# Patient Record
Sex: Male | Born: 1960 | Race: White | Hispanic: No | Marital: Married | Smoking: Former smoker
Health system: Southern US, Community
[De-identification: ages and names within clinical notes are randomized; demographics above are authoritative.]

## PROBLEM LIST (undated history)

## (undated) DIAGNOSIS — M109 Gout, unspecified: Secondary | ICD-10-CM

## (undated) DIAGNOSIS — K219 Gastro-esophageal reflux disease without esophagitis: Secondary | ICD-10-CM

## (undated) DIAGNOSIS — E7800 Pure hypercholesterolemia, unspecified: Secondary | ICD-10-CM

## (undated) DIAGNOSIS — I2510 Atherosclerotic heart disease of native coronary artery without angina pectoris: Secondary | ICD-10-CM

## (undated) DIAGNOSIS — I10 Essential (primary) hypertension: Secondary | ICD-10-CM

## (undated) DIAGNOSIS — C61 Malignant neoplasm of prostate: Secondary | ICD-10-CM

## (undated) HISTORY — PX: PROSTATE BIOPSY: SHX241

## (undated) HISTORY — DX: Atherosclerotic heart disease of native coronary artery without angina pectoris: I25.10

## (undated) HISTORY — PX: ORIF ANKLE FRACTURE: SHX5408

## (undated) HISTORY — PX: APPENDECTOMY: SHX54

---

## 1986-09-27 HISTORY — PX: SKIN GRAFT: SHX250

## 2010-01-04 ENCOUNTER — Ambulatory Visit: Payer: Self-pay | Admitting: Interventional Radiology

## 2010-01-04 ENCOUNTER — Emergency Department (HOSPITAL_BASED_OUTPATIENT_CLINIC_OR_DEPARTMENT_OTHER): Admission: EM | Admit: 2010-01-04 | Discharge: 2010-01-04 | Payer: Self-pay | Admitting: Emergency Medicine

## 2013-03-20 ENCOUNTER — Other Ambulatory Visit (HOSPITAL_BASED_OUTPATIENT_CLINIC_OR_DEPARTMENT_OTHER): Payer: Self-pay | Admitting: Family Medicine

## 2013-03-20 DIAGNOSIS — R1012 Left upper quadrant pain: Secondary | ICD-10-CM

## 2013-03-20 DIAGNOSIS — S298XXA Other specified injuries of thorax, initial encounter: Secondary | ICD-10-CM

## 2013-03-21 ENCOUNTER — Ambulatory Visit (HOSPITAL_BASED_OUTPATIENT_CLINIC_OR_DEPARTMENT_OTHER)
Admission: RE | Admit: 2013-03-21 | Discharge: 2013-03-21 | Disposition: A | Discharge status: Home / Self Care | Payer: BC Managed Care – PPO | Source: Ambulatory Visit | Attending: Family Medicine | Admitting: Family Medicine

## 2013-03-21 ENCOUNTER — Encounter (HOSPITAL_BASED_OUTPATIENT_CLINIC_OR_DEPARTMENT_OTHER): Payer: Self-pay

## 2013-03-21 DIAGNOSIS — S2249XA Multiple fractures of ribs, unspecified side, initial encounter for closed fracture: Secondary | ICD-10-CM | POA: Insufficient documentation

## 2013-03-21 DIAGNOSIS — I700 Atherosclerosis of aorta: Secondary | ICD-10-CM | POA: Insufficient documentation

## 2013-03-21 DIAGNOSIS — R1012 Left upper quadrant pain: Secondary | ICD-10-CM

## 2013-03-21 DIAGNOSIS — S32009A Unspecified fracture of unspecified lumbar vertebra, initial encounter for closed fracture: Secondary | ICD-10-CM | POA: Insufficient documentation

## 2013-03-21 DIAGNOSIS — R109 Unspecified abdominal pain: Secondary | ICD-10-CM | POA: Insufficient documentation

## 2013-03-21 DIAGNOSIS — S298XXA Other specified injuries of thorax, initial encounter: Secondary | ICD-10-CM

## 2013-03-21 DIAGNOSIS — W19XXXA Unspecified fall, initial encounter: Secondary | ICD-10-CM | POA: Insufficient documentation

## 2013-03-21 HISTORY — DX: Essential (primary) hypertension: I10

## 2013-03-21 MED ORDER — IOHEXOL 300 MG/ML  SOLN
100.0000 mL | Freq: Once | INTRAMUSCULAR | Status: AC | PRN
  Administered 2013-03-21: 100 mL via INTRAVENOUS

## 2013-06-23 ENCOUNTER — Emergency Department (HOSPITAL_BASED_OUTPATIENT_CLINIC_OR_DEPARTMENT_OTHER)
Admission: EM | Admit: 2013-06-23 | Discharge: 2013-06-23 | Disposition: A | Discharge status: Home / Self Care | Payer: BC Managed Care – PPO | Attending: Emergency Medicine | Admitting: Emergency Medicine

## 2013-06-23 ENCOUNTER — Encounter (HOSPITAL_BASED_OUTPATIENT_CLINIC_OR_DEPARTMENT_OTHER): Payer: Self-pay

## 2013-06-23 DIAGNOSIS — R233 Spontaneous ecchymoses: Secondary | ICD-10-CM

## 2013-06-23 DIAGNOSIS — K219 Gastro-esophageal reflux disease without esophagitis: Secondary | ICD-10-CM | POA: Insufficient documentation

## 2013-06-23 DIAGNOSIS — I10 Essential (primary) hypertension: Secondary | ICD-10-CM | POA: Insufficient documentation

## 2013-06-23 DIAGNOSIS — E7800 Pure hypercholesterolemia, unspecified: Secondary | ICD-10-CM | POA: Insufficient documentation

## 2013-06-23 DIAGNOSIS — R21 Rash and other nonspecific skin eruption: Secondary | ICD-10-CM | POA: Insufficient documentation

## 2013-06-23 DIAGNOSIS — Z79899 Other long term (current) drug therapy: Secondary | ICD-10-CM | POA: Insufficient documentation

## 2013-06-23 DIAGNOSIS — D692 Other nonthrombocytopenic purpura: Secondary | ICD-10-CM | POA: Insufficient documentation

## 2013-06-23 HISTORY — DX: Gastro-esophageal reflux disease without esophagitis: K21.9

## 2013-06-23 HISTORY — DX: Pure hypercholesterolemia, unspecified: E78.00

## 2013-06-23 LAB — URINALYSIS, ROUTINE W REFLEX MICROSCOPIC
Glucose, UA: NEGATIVE mg/dL
Hgb urine dipstick: NEGATIVE
Nitrite: NEGATIVE
Protein, ur: NEGATIVE mg/dL
Specific Gravity, Urine: 1.012 (ref 1.005–1.030)
Urobilinogen, UA: 1 mg/dL (ref 0.0–1.0)
pH: 5.5 (ref 5.0–8.0)

## 2013-06-23 LAB — COMPREHENSIVE METABOLIC PANEL
AST: 49 U/L — ABNORMAL HIGH (ref 0–37)
Alkaline Phosphatase: 112 U/L (ref 39–117)
BUN: 16 mg/dL (ref 6–23)
CO2: 28 mEq/L (ref 19–32)
Calcium: 9.5 mg/dL (ref 8.4–10.5)
GFR calc Af Amer: 78 mL/min — ABNORMAL LOW (ref >90)
Potassium: 3.1 mEq/L — ABNORMAL LOW (ref 3.5–5.1)
Sodium: 135 mEq/L (ref 135–145)

## 2013-06-23 LAB — APTT: aPTT: 34 seconds (ref 24–37)

## 2013-06-23 LAB — CBC WITH DIFFERENTIAL/PLATELET
Basophils Absolute: 0.1 10*3/uL (ref 0.0–0.1)
Basophils Relative: 0 % (ref 0–1)
Eosinophils Absolute: 0 10*3/uL (ref 0.0–0.7)
Eosinophils Relative: 0 % (ref 0–5)
Lymphocytes Relative: 11 % — ABNORMAL LOW (ref 12–46)
Lymphs Abs: 1.4 10*3/uL (ref 0.7–4.0)
MCV: 97.8 fL (ref 78.0–100.0)
Monocytes Relative: 7 % (ref 3–12)
Neutro Abs: 10.6 10*3/uL — ABNORMAL HIGH (ref 1.7–7.7)
RBC: 4.01 MIL/uL — ABNORMAL LOW (ref 4.22–5.81)
RDW: 14.8 % (ref 11.5–15.5)

## 2013-06-23 NOTE — ED Provider Notes (Signed)
CSN: [id number]     Arrival date & time 06/23/13  1811 History   First MD Initiated Contact with Patient 06/23/13 1828     Chief Complaint  Patient presents with  . Bleeding/Bruising   (Consider location/radiation/quality/duration/timing/severity/associated sxs/prior Treatment) HPI Comments: Patient presents with complaint of rash that began 3 days ago on his feet and gradually moved to his legs and abdomen below waist. Patient has noted rash on arms today. He has approximately 15 pound weight loss over the past several months. The tick bites, no medications or exposures. Patient endorses fatigue but states this is not new recently. He denies fever but has night sweats, which also are not new. He denies recent upper respiratory tract infection or illness. He denies chest pain, cough, shortness of breath. He denies nausea, vomiting, diarrhea, urinary symptoms. Patient denies easy bruising, blood in his stool or urine. No history of similar rash. Onset of symptoms gradual. Course is gradually worsening. Nothing makes symptoms better or worse.  The history is provided by the patient.    Past Medical History  Diagnosis Date  . Hypertension   . High cholesterol   . Acid reflux    History reviewed. No pertinent past surgical history. No family history on file. History  Substance Use Topics  . Smoking status: Never Smoker   . Smokeless tobacco: Not on file  . Alcohol Use: Not on file    Review of Systems  Constitutional: Negative for fever.  HENT: Negative for sore throat and rhinorrhea.   Eyes: Negative for redness.  Respiratory: Negative for cough.   Cardiovascular: Negative for chest pain.  Gastrointestinal: Negative for nausea, vomiting, abdominal pain and diarrhea.  Genitourinary: Negative for dysuria.  Musculoskeletal: Negative for myalgias.  Skin: Positive for rash.  Neurological: Negative for headaches.  Hematological: Negative for adenopathy. Does not bruise/bleed easily.     Allergies  Review of patient's allergies indicates no known allergies.  Home Medications   Current Outpatient Rx  Name  Route  Sig  Dispense  Refill  . diltiazem (TIAZAC) 240 MG 24 hr capsule   Oral   Take 240 mg by mouth daily.         . fish oil-omega-3 fatty acids 1000 MG capsule   Oral   Take 2 g by mouth daily.         . indomethacin (INDOCIN SR) 75 MG CR capsule   Oral   Take 75 mg by mouth 2 (two) times daily with a meal.         . lovastatin (MEVACOR) 20 MG tablet   Oral   Take 20 mg by mouth at bedtime.         . RABEprazole (ACIPHEX) 20 MG tablet   Oral   Take 20 mg by mouth daily.         . valsartan-hydrochlorothiazide (DIOVAN-HCT) 160-25 MG per tablet   Oral   Take 1 tablet by mouth daily.         . vitamin E (VITAMIN E) 400 UNIT capsule   Oral   Take 400 Units by mouth daily.          BP 122/77  Pulse 102  Temp(Src) 98.2 F (36.8 C) (Oral)  Resp 18  Ht 6\' 4"  (1.93 m)  Wt 260 lb (117.935 kg)  BMI 31.66 kg/m2  SpO2 98% Physical Exam  Nursing note and vitals reviewed. Constitutional: He appears well-developed and well-nourished.  HENT:  Head: Normocephalic and atraumatic.  Normal intraoral  mucosa.  Eyes: Conjunctivae are normal. Right eye exhibits no discharge. Left eye exhibits no discharge.  Conjunctivae normal, no pallor  Neck: Normal range of motion. Neck supple.  Cardiovascular: Normal rate, regular rhythm and normal heart sounds.   Pulmonary/Chest: Effort normal and breath sounds normal.  Abdominal: Soft. There is no tenderness.  Neurological: He is alert.  Skin: Skin is warm and dry. Petechiae, purpura and rash noted. No abrasion, no bruising and no ecchymosis noted.     Psychiatric: He has a normal mood and affect.    ED Course  Procedures (including critical care time) Labs Review Labs Reviewed  CBC WITH DIFFERENTIAL - Abnormal; Notable for the following:    WBC 12.9 (*)    RBC 4.01 (*)    MCH 34.4 (*)     Neutrophils Relative % 82 (*)    Neutro Abs 10.6 (*)    Lymphocytes Relative 11 (*)    All other components within normal limits  COMPREHENSIVE METABOLIC PANEL - Abnormal; Notable for the following:    Potassium 3.1 (*)    Chloride 95 (*)    Glucose, Bld 112 (*)    Albumin 3.4 (*)    AST 49 (*)    Total Bilirubin 1.3 (*)    GFR calc non Af Amer 67 (*)    GFR calc Af Amer 78 (*)    All other components within normal limits  PROTIME-INR  APTT  URINALYSIS, ROUTINE W REFLEX MICROSCOPIC  ROCKY MTN SPOTTED FVR AB, IGG-BLOOD  ROCKY MTN SPOTTED FVR AB, IGM-BLOOD   Imaging Review No results found.  Patient seen and examined. D/w Dr. Fredderick Phenix who has seen. Work-up initiated. Medications ordered. Reviewed CT chest from 07/14.  Vital signs reviewed and are as follows: Filed Vitals:   06/23/13 1818  BP: 122/77  Pulse: 102  Temp: 98.2 F (36.8 C)  Resp: 18   Labs reviewed. Patient informed.  I have spoken with Dr. Wynelle Link, who is on call for Dr. Foy Guadalajara. No further testing requested. She will have office call patient for f/u on Monday morning (2 days).   Patient informed of plan and him and wife are in agreement. Patient told to return with changing symptoms, fever, significantly worsening rash. [place] Spotted Fever titers are pending.  Patient told to return with worsening or changing symptoms, including fever.    MDM   1. Petechial rash    Patient with impressive petechial/purpuric rash. Platelets normal. No concern for meningococcemia given rash without other symptoms. Patient appears well, non-toxic. RMSF felt unlikely but titers sent. ?viral etiology although no recent or other symptoms. Indomethacin, which patient used once recently, could be inciting factor and patient urged to discontinue for time being. At this point, no lab or PE findings that would warrant admission. Close PCP f/u obtained.    Renne Crigler, PA-C 06/24/13 0009

## 2013-06-23 NOTE — ED Notes (Signed)
Patient here with fine purpura rash to lower extremities and now on right arm, reports started on Wednesday. Denies any illness prior to the rash

## 2013-06-24 NOTE — ED Provider Notes (Signed)
Medical screening examination/treatment/procedure(s) were performed by non-physician practitioner and as supervising physician I was immediately available for consultation/collaboration.   Rolan Bucco, MD 06/24/13 [phone]

## 2013-06-26 LAB — ROCKY MTN SPOTTED FVR AB, IGG-BLOOD: RMSF IgG: 0.39 IV

## 2014-05-09 ENCOUNTER — Encounter (HOSPITAL_COMMUNITY)
Admission: AD | Disposition: A | Discharge status: Home / Self Care | Payer: Self-pay | Source: Ambulatory Visit | Attending: Orthopedic Surgery

## 2014-05-09 ENCOUNTER — Inpatient Hospital Stay (HOSPITAL_COMMUNITY): Payer: BC Managed Care – PPO | Admitting: Anesthesiology

## 2014-05-09 ENCOUNTER — Encounter (HOSPITAL_COMMUNITY): Payer: BC Managed Care – PPO | Admitting: Anesthesiology

## 2014-05-09 ENCOUNTER — Ambulatory Visit (HOSPITAL_COMMUNITY)
Admission: AD | Admit: 2014-05-09 | Discharge: 2014-05-09 | Disposition: A | Discharge status: Home / Self Care | Payer: BC Managed Care – PPO | Source: Ambulatory Visit | Attending: Orthopedic Surgery | Admitting: Orthopedic Surgery

## 2014-05-09 ENCOUNTER — Inpatient Hospital Stay (HOSPITAL_COMMUNITY): Payer: BC Managed Care – PPO

## 2014-05-09 ENCOUNTER — Encounter (HOSPITAL_COMMUNITY): Payer: Self-pay | Admitting: Orthopedic Surgery

## 2014-05-09 DIAGNOSIS — IMO0002 Reserved for concepts with insufficient information to code with codable children: Secondary | ICD-10-CM | POA: Diagnosis present

## 2014-05-09 DIAGNOSIS — Z79899 Other long term (current) drug therapy: Secondary | ICD-10-CM | POA: Insufficient documentation

## 2014-05-09 DIAGNOSIS — I10 Essential (primary) hypertension: Secondary | ICD-10-CM | POA: Insufficient documentation

## 2014-05-09 DIAGNOSIS — W2203XA Walked into furniture, initial encounter: Secondary | ICD-10-CM | POA: Insufficient documentation

## 2014-05-09 DIAGNOSIS — K219 Gastro-esophageal reflux disease without esophagitis: Secondary | ICD-10-CM | POA: Diagnosis not present

## 2014-05-09 DIAGNOSIS — E7800 Pure hypercholesterolemia, unspecified: Secondary | ICD-10-CM | POA: Diagnosis not present

## 2014-05-09 HISTORY — DX: Gout, unspecified: M10.9

## 2014-05-09 HISTORY — PX: PERCUTANEOUS PINNING: SHX2209

## 2014-05-09 LAB — BASIC METABOLIC PANEL
ANION GAP: 21 — AB (ref 5–15)
BUN: 22 mg/dL (ref 6–23)
CHLORIDE: 93 meq/L — AB (ref 96–112)
CO2: 21 mEq/L (ref 19–32)
Calcium: 9.6 mg/dL (ref 8.4–10.5)
Creatinine, Ser: 1.06 mg/dL (ref 0.50–1.35)
GFR calc non Af Amer: 78 mL/min — ABNORMAL LOW (ref >90)
GLUCOSE: 110 mg/dL — AB (ref 70–99)
POTASSIUM: 3.3 meq/L — AB (ref 3.7–5.3)
SODIUM: 135 meq/L — AB (ref 137–147)

## 2014-05-09 LAB — CBC
HCT: 40.3 % (ref 39.0–52.0)
HEMOGLOBIN: 14.5 g/dL (ref 13.0–17.0)
MCH: 34.4 pg — ABNORMAL HIGH (ref 26.0–34.0)
MCHC: 36 g/dL (ref 30.0–36.0)
MCV: 95.5 fL (ref 78.0–100.0)
Platelets: 152 10*3/uL (ref 150–400)
RBC: 4.22 MIL/uL (ref 4.22–5.81)
RDW: 15.5 % (ref 11.5–15.5)
WBC: 8.9 10*3/uL (ref 4.0–10.5)

## 2014-05-09 SURGERY — PINNING, EXTREMITY, PERCUTANEOUS
Anesthesia: Regional | Site: Hand | Laterality: Left

## 2014-05-09 MED ORDER — ONDANSETRON HCL 4 MG/2ML IJ SOLN: 4.0000 mg | Freq: Once | INTRAMUSCULAR | Status: DC | PRN

## 2014-05-09 MED ORDER — OXYCODONE HCL 5 MG/5ML PO SOLN: 5.0000 mg | Freq: Once | ORAL | Status: DC | PRN

## 2014-05-09 MED ORDER — BUPIVACAINE HCL (PF) 0.25 % IJ SOLN: INTRAMUSCULAR | Status: DC | PRN

## 2014-05-09 MED ORDER — TETANUS-DIPHTH-ACELL PERTUSSIS 5-2.5-18.5 LF-MCG/0.5 IM SUSP
0.5000 mL | Freq: Once | INTRAMUSCULAR | Status: AC
  Administered 2014-05-09: 0.5 mL via INTRAMUSCULAR
  Filled 2014-05-09: qty 0.5

## 2014-05-09 MED ORDER — CHLORHEXIDINE GLUCONATE 4 % EX LIQD
60.0000 mL | Freq: Once | CUTANEOUS | Status: DC
  Filled 2014-05-09: qty 60

## 2014-05-09 MED ORDER — CEFAZOLIN SODIUM-DEXTROSE 2-3 GM-% IV SOLR
2.0000 g | INTRAVENOUS | Status: DC
  Filled 2014-05-09: qty 50

## 2014-05-09 MED ORDER — LACTATED RINGERS IV SOLN
INTRAVENOUS | Status: DC | PRN
  Administered 2014-05-09: 18:00:00 via INTRAVENOUS

## 2014-05-09 MED ORDER — OXYCODONE-ACETAMINOPHEN 5-325 MG PO TABS: ORAL_TABLET | ORAL | Status: DC

## 2014-05-09 MED ORDER — PROPOFOL INFUSION 10 MG/ML OPTIME
INTRAVENOUS | Status: DC | PRN
  Administered 2014-05-09: 100 ug/kg/min via INTRAVENOUS

## 2014-05-09 MED ORDER — MIDAZOLAM HCL 2 MG/2ML IJ SOLN
INTRAMUSCULAR | Status: AC
  Filled 2014-05-09: qty 2

## 2014-05-09 MED ORDER — OXYCODONE HCL 5 MG PO TABS: 5.0000 mg | ORAL_TABLET | Freq: Once | ORAL | Status: DC | PRN

## 2014-05-09 MED ORDER — MIDAZOLAM HCL 5 MG/5ML IJ SOLN
INTRAMUSCULAR | Status: DC | PRN
  Administered 2014-05-09: 2 mg via INTRAVENOUS

## 2014-05-09 MED ORDER — BUPIVACAINE-EPINEPHRINE (PF) 0.5% -1:200000 IJ SOLN
INTRAMUSCULAR | Status: DC | PRN
  Administered 2014-05-09: 30 mL via PERINEURAL

## 2014-05-09 MED ORDER — MEPERIDINE HCL 25 MG/ML IJ SOLN: 6.2500 mg | INTRAMUSCULAR | Status: DC | PRN

## 2014-05-09 MED ORDER — FENTANYL CITRATE 0.05 MG/ML IJ SOLN
INTRAMUSCULAR | Status: DC | PRN
  Administered 2014-05-09: 100 ug via INTRAVENOUS

## 2014-05-09 MED ORDER — FENTANYL CITRATE 0.05 MG/ML IJ SOLN
INTRAMUSCULAR | Status: AC
  Filled 2014-05-09: qty 5

## 2014-05-09 MED ORDER — PROPOFOL 10 MG/ML IV BOLUS
INTRAVENOUS | Status: AC
  Filled 2014-05-09: qty 20

## 2014-05-09 MED ORDER — BUPIVACAINE HCL (PF) 0.25 % IJ SOLN
INTRAMUSCULAR | Status: AC
  Filled 2014-05-09: qty 30

## 2014-05-09 MED ORDER — 0.9 % SODIUM CHLORIDE (POUR BTL) OPTIME
TOPICAL | Status: DC | PRN
  Administered 2014-05-09: 1000 mL

## 2014-05-09 MED ORDER — LACTATED RINGERS IV SOLN
INTRAVENOUS | Status: DC
Start: 2014-05-09 — End: 2014-05-10
  Administered 2014-05-09: 18:00:00 via INTRAVENOUS

## 2014-05-09 MED ORDER — HYDROMORPHONE HCL PF 1 MG/ML IJ SOLN: 0.2500 mg | INTRAMUSCULAR | Status: DC | PRN

## 2014-05-09 MED ORDER — SULFAMETHOXAZOLE-TRIMETHOPRIM 800-160 MG PO TABS: 1.0000 | ORAL_TABLET | Freq: Two times a day (BID) | ORAL | Status: DC

## 2014-05-09 SURGICAL SUPPLY — 54 items
BANDAGE COBAN STERILE 2 (GAUZE/BANDAGES/DRESSINGS) IMPLANT
BANDAGE ELASTIC 3 VELCRO ST LF (GAUZE/BANDAGES/DRESSINGS) ×3 IMPLANT
BANDAGE ELASTIC 4 VELCRO ST LF (GAUZE/BANDAGES/DRESSINGS) ×3 IMPLANT
BENZOIN TINCTURE PRP APPL 2/3 (GAUZE/BANDAGES/DRESSINGS) IMPLANT
BLADE SURG ROTATE 9660 (MISCELLANEOUS) IMPLANT
BNDG ESMARK 4X9 LF (GAUZE/BANDAGES/DRESSINGS) ×3 IMPLANT
BNDG GAUZE ELAST 4 BULKY (GAUZE/BANDAGES/DRESSINGS) ×3 IMPLANT
CHLORAPREP W/TINT 26ML (MISCELLANEOUS) ×3 IMPLANT
CLOSURE WOUND 1/2 X4 (GAUZE/BANDAGES/DRESSINGS)
CORDS BIPOLAR (ELECTRODE) ×3 IMPLANT
COVER SURGICAL LIGHT HANDLE (MISCELLANEOUS) ×3 IMPLANT
CUFF TOURNIQUET SINGLE 18IN (TOURNIQUET CUFF) ×3 IMPLANT
CUFF TOURNIQUET SINGLE 24IN (TOURNIQUET CUFF) IMPLANT
DRAPE C-ARM MINI 42X72 WSTRAPS (DRAPES) ×3 IMPLANT
DRAPE OEC MINIVIEW 54X84 (DRAPES) ×3 IMPLANT
DRAPE SURG 17X23 STRL (DRAPES) ×3 IMPLANT
DRSG EMULSION OIL 3X3 NADH (GAUZE/BANDAGES/DRESSINGS) ×3 IMPLANT
GAUZE SPONGE 4X4 12PLY STRL (GAUZE/BANDAGES/DRESSINGS) ×3 IMPLANT
GAUZE XEROFORM 1X8 LF (GAUZE/BANDAGES/DRESSINGS) ×3 IMPLANT
GLOVE BIO SURGEON STRL SZ7.5 (GLOVE) ×3 IMPLANT
GLOVE BIOGEL PI IND STRL 6.5 (GLOVE) ×1 IMPLANT
GLOVE BIOGEL PI IND STRL 8 (GLOVE) ×1 IMPLANT
GLOVE BIOGEL PI INDICATOR 6.5 (GLOVE) ×2
GLOVE BIOGEL PI INDICATOR 8 (GLOVE) ×2
GLOVE ECLIPSE 6.5 STRL STRAW (GLOVE) ×3 IMPLANT
GOWN BRE IMP PREV XXLGXLNG (GOWN DISPOSABLE) ×3 IMPLANT
GOWN STRL REUS W/ TWL LRG LVL3 (GOWN DISPOSABLE) ×2 IMPLANT
GOWN STRL REUS W/TWL LRG LVL3 (GOWN DISPOSABLE) ×4
K-WIRE SMTH SNGL TROCAR .028X4 (WIRE) ×3
KIT BASIN OR (CUSTOM PROCEDURE TRAY) ×3 IMPLANT
KIT ROOM TURNOVER OR (KITS) ×3 IMPLANT
KWIRE SMTH SNGL TROCAR .028X4 (WIRE) ×1 IMPLANT
MANIFOLD NEPTUNE II (INSTRUMENTS) IMPLANT
NEEDLE HYPO 25GX1X1/2 BEV (NEEDLE) ×3 IMPLANT
NS IRRIG 1000ML POUR BTL (IV SOLUTION) ×3 IMPLANT
PACK ORTHO EXTREMITY (CUSTOM PROCEDURE TRAY) ×3 IMPLANT
PAD ARMBOARD 7.5X6 YLW CONV (MISCELLANEOUS) ×6 IMPLANT
PADDING CAST ABS 3INX4YD NS (CAST SUPPLIES) ×2
PADDING CAST ABS COTTON 3X4 (CAST SUPPLIES) ×1 IMPLANT
SPLINT PLASTER CAST XFAST 3X15 (CAST SUPPLIES) ×1 IMPLANT
SPLINT PLASTER XTRA FASTSET 3X (CAST SUPPLIES) ×2
SPONGE GAUZE 4X4 12PLY STER LF (GAUZE/BANDAGES/DRESSINGS) ×3 IMPLANT
STRIP CLOSURE SKIN 1/2X4 (GAUZE/BANDAGES/DRESSINGS) IMPLANT
SUCTION FRAZIER TIP 10 FR DISP (SUCTIONS) ×3 IMPLANT
SUT ETHILON 4 0 P 3 18 (SUTURE) IMPLANT
SUT MON AB 5-0 P3 18 (SUTURE) ×3 IMPLANT
SUT PROLENE 4 0 P 3 18 (SUTURE) IMPLANT
SYR CONTROL 10ML LL (SYRINGE) ×3 IMPLANT
TOWEL OR 17X24 6PK STRL BLUE (TOWEL DISPOSABLE) ×3 IMPLANT
TOWEL OR 17X26 10 PK STRL BLUE (TOWEL DISPOSABLE) ×3 IMPLANT
TUBE CONNECTING 12'X1/4 (SUCTIONS) ×1
TUBE CONNECTING 12X1/4 (SUCTIONS) ×2 IMPLANT
TUBE FEEDING 5FR 15 INCH (TUBING) IMPLANT
WATER STERILE IRR 1000ML POUR (IV SOLUTION) ×3 IMPLANT

## 2014-05-09 NOTE — Discharge Instructions (Signed)

## 2014-05-09 NOTE — Op Note (Signed)
[id number] 

## 2014-05-09 NOTE — Anesthesia Preprocedure Evaluation (Signed)
Anesthesia Evaluation  Patient identified by MRN, date of birth, ID band Patient awake    Reviewed: Allergy & Precautions, H&P , NPO status , Patient's Chart, lab work & pertinent test results  Airway Mallampati: I TM Distance: >3 FB Neck ROM: Full    Dental   Pulmonary          Cardiovascular hypertension, Pt. on medications     Neuro/Psych    GI/Hepatic GERD-  Medicated and Controlled,  Endo/Other    Renal/GU      Musculoskeletal   Abdominal   Peds  Hematology   Anesthesia Other Findings   Reproductive/Obstetrics                           Anesthesia Physical Anesthesia Plan  ASA: II  Anesthesia Plan: Regional   Post-op Pain Management:    Induction: Intravenous  Airway Management Planned: Natural Airway  Additional Equipment:   Intra-op Plan:   Post-operative Plan:   Informed Consent: I have reviewed the patients History and Physical, chart, labs and discussed the procedure including the risks, benefits and alternatives for the proposed anesthesia with the patient or authorized representative who has indicated his/her understanding and acceptance.     Plan Discussed with: CRNA and Surgeon  Anesthesia Plan Comments:         Anesthesia Quick Evaluation

## 2014-05-09 NOTE — H&P (Signed)
  Matthew Johnson is an 53 y.o. male.   Chief Complaint: left small finger fracture HPI: 53 yo rhd male states he caught left small finger in mechanism of reclining chair while moving it causing injury to left small finger.  Seen at Urgent Care and then at [facility] and referred for further care.  Reports no previous injury to left hand and no other injury at this time.  Past Medical History  Diagnosis Date  . Hypertension   . High cholesterol   . Acid reflux     Past Surgical History  Procedure Laterality Date  . Orif ankle fracture      No family history on file. Social History:  reports that he has never smoked. He does not have any smokeless tobacco history on file. His alcohol and drug histories are not on file.  Allergies: No Known Allergies  Medications Prior to Admission  Medication Sig Dispense Refill  . Carboxymethylcellul-Glycerin (CLEAR EYES FOR DRY EYES OP) Place 3 drops into both eyes daily.      Marland Kitchen diltiazem (TIAZAC) 240 MG 24 hr capsule Take 240 mg by mouth daily.      . indomethacin (INDOCIN SR) 75 MG CR capsule Take 75 mg by mouth 2 (two) times daily as needed for moderate pain.       Marland Kitchen lovastatin (MEVACOR) 20 MG tablet Take 20 mg by mouth at bedtime.      . Multiple Vitamins-Minerals (MULTIVITAMIN PO) Take 1 tablet by mouth every morning.      . Omega-3 Fatty Acids (FISH OIL) 1200 MG CAPS Take 4,800 mg by mouth every morning.      . RABEprazole (ACIPHEX) 20 MG tablet Take 20 mg by mouth daily as needed (for heart burn).       . valsartan-hydrochlorothiazide (DIOVAN-HCT) 160-25 MG per tablet Take 1 tablet by mouth daily.        No results found for this or any previous visit (from the past 48 hour(s)).  No results found.   A comprehensive review of systems was negative.  Blood pressure 129/68, pulse 69, temperature 98.2 F (36.8 C), temperature source Oral, resp. rate 20, SpO2 97.00%.  General appearance: alert, cooperative and appears stated age Head:  Normocephalic, without obvious abnormality, atraumatic Neck: supple, symmetrical, trachea midline Resp: clear to auscultation bilaterally Cardio: regular rate and rhythm GI: non tender Extremities: intact sensation and capillary refill all digits.  +epl/fpl/io.  left small finger with volar and dorsal wounds at dip joint.  able to slightly flex/extend dip joint.  intact sensation and capillary refill in small finger.  no other wounds. Pulses: 2+ and symmetric Skin: Skin color, texture, turgor normal. No rashes or lesions Neurologic: Grossly normal Incision/Wound: As above  Assessment/Plan Left small finger middle phalanx condyle fracture with overlying lacerations.  Recommend OR for I&D of wounds and crpp vs orif middle phalanx condyle fracture.  Risks, benefits, and alternatives of surgery were discussed and the patient agrees with the plan of care.   Casia Corti R 05/09/2014, 6:05 PM

## 2014-05-09 NOTE — Progress Notes (Signed)
Orthopedic Tech Progress Note Patient Details:  Guido Comp 04/18/1961 [id number]  Ortho Devices Type of Ortho Device: Arm sling Ortho Device/Splint Location: LUE Ortho Device/Splint Interventions: Ordered;Application   Braulio Bosch 05/09/2014, 8:57 PM

## 2014-05-09 NOTE — Anesthesia Postprocedure Evaluation (Signed)
Anesthesia Post Note  Patient: Investment banker, operational  Procedure(s) Performed: Procedure(s) (LRB): Closed Reduction Percutaneous Pinning Left Small Finger (Left)  Anesthesia type: general  Patient location: PACU  Post pain: Pain level controlled  Post assessment: Patient's Cardiovascular Status Stable  Last Vitals:  Filed Vitals:   05/09/14 2015  BP: 121/66  Pulse: 59  Temp: 37 C  Resp: 19    Post vital signs: Reviewed and stable  Level of consciousness: sedated  Complications: No apparent anesthesia complications

## 2014-05-09 NOTE — Brief Op Note (Signed)
05/09/2014  7:58 PM  PATIENT:  Matthew Johnson  53 y.o. male  PRE-OPERATIVE DIAGNOSIS:  Left Small Finger Fracture  POST-OPERATIVE DIAGNOSIS:  left small finger fracture  PROCEDURE:  Left small finger irrigation and debridement open fracture, open reduction and pinning middle phalanx intraarticular condyle fracture  SURGEON:  Surgeon(s) and Role:    * Leanora Cover, MD - Primary  PHYSICIAN ASSISTANT:   ASSISTANTS: none   ANESTHESIA:   regional  EBL:     BLOOD ADMINISTERED:none  DRAINS: none   LOCAL MEDICATIONS USED:  NONE  SPECIMEN:  No Specimen  DISPOSITION OF SPECIMEN:  N/A  COUNTS:  YES  TOURNIQUET:   Total Tourniquet Time Documented: Upper Arm (Left) - 41 minutes Total: Upper Arm (Left) - 41 minutes   DICTATION: .Other Dictation: Dictation Number [id number]  PLAN OF CARE: Discharge to home after PACU  PATIENT DISPOSITION:  PACU - hemodynamically stable.

## 2014-05-09 NOTE — Transfer of Care (Signed)
Immediate Anesthesia Transfer of Care Note  Patient: Matthew Johnson  Procedure(s) Performed: Procedure(s): Closed Reduction Percutaneous Pinning Left Small Finger (Left)  Patient Location: PACU  Anesthesia Type:MAC  Level of Consciousness: awake, alert  and oriented  Airway & Oxygen Therapy: Patient Spontanous Breathing and Patient connected to nasal cannula oxygen  Post-op Assessment: Report given to PACU RN and Post -op Vital signs reviewed and stable  Post vital signs: Reviewed and stable  Complications: No apparent anesthesia complications

## 2014-05-09 NOTE — Anesthesia Procedure Notes (Addendum)
Anesthesia Regional Block:  Supraclavicular block  Pre-Anesthetic Checklist: ,, timeout performed, Correct Patient, Correct Site, Correct Laterality, Correct Procedure, Correct Position, site marked, Risks and benefits discussed,  Surgical consent,  Pre-op evaluation,  At surgeon's request and post-op pain management  Laterality: Left  Prep: chloraprep       Needles:  Injection technique: Single-shot  Needle Type: Echogenic Stimulator Needle     Needle Length: 9cm 9 cm Needle Gauge: 21 and 21 G    Additional Needles:  Procedures: ultrasound guided (picture in chart) and nerve stimulator Supraclavicular block  Nerve Stimulator or Paresthesia:  Response: 0.4 mA,   Additional Responses:   Narrative:  Start time: 05/09/2014 6:35 PM End time: 05/09/2014 6:45 PM Injection made incrementally with aspirations every 5 mL.  Performed by: Personally  Anesthesiologist: Lillia Abed MD  Additional Notes: Monitors applied. Patient sedated. Sterile prep and drape,hand hygiene and sterile gloves were used. Relevant anatomy identified.Needle position confirmed.Local anesthetic injected incrementally after negative aspiration. Local anesthetic spread visualized around nerve(s). Vascular puncture avoided. No complications. Image printed for medical record.The patient tolerated the procedure well.        Date/Time: 05/09/2014 7:05 PM Performed by: Eligha Bridegroom Pre-anesthesia Checklist: Patient identified, Timeout performed, Emergency Drugs available, Suction available and Patient being monitored Patient Re-evaluated:Patient Re-evaluated prior to inductionOxygen Delivery Method: Nasal cannula Intubation Type: IV induction

## 2014-05-09 NOTE — Op Note (Signed)
Intra-operative fluoroscopic images in the AP, lateral, and oblique views were taken and evaluated by myself.  Reduction and hardware placement were confirmed.  There was no intraarticular penetration of permanent hardware.  

## 2014-05-09 NOTE — Progress Notes (Signed)
05/09/14 1818  OBSTRUCTIVE SLEEP APNEA  Have you ever been diagnosed with sleep apnea through a sleep study? No  Do you snore loudly (loud enough to be heard through closed doors)?  0  Do you often feel tired, fatigued, or sleepy during the daytime? 1  Has anyone observed you stop breathing during your sleep? 0  Do you have, or are you being treated for high blood pressure? 1  BMI more than 35 kg/m2? 0  Age over 54 years old? 1  Neck circumference greater than 40 cm/16 inches? 1  Gender: 1  Obstructive Sleep Apnea Score 5  Score 4 or greater  Results sent to PCP

## 2014-05-09 NOTE — Progress Notes (Signed)
Report given to Richard, CRNA °

## 2014-05-09 NOTE — Progress Notes (Signed)
Valuables locked up in security

## 2014-05-10 ENCOUNTER — Encounter (HOSPITAL_COMMUNITY): Payer: Self-pay | Admitting: Orthopedic Surgery

## 2014-05-10 NOTE — Op Note (Signed)
NAME:  Matthew Johnson, Matthew Johnson NO.:  [id number]  MEDICAL RECORD NO.:  [id number]  LOCATION:  [facility]                         FACILITY:  [facility]  PHYSICIAN:  Leanora Cover, MD        DATE OF BIRTH:  [birth year]  DATE OF PROCEDURE:  05/09/2014 DATE OF DISCHARGE:  05/09/2014                              OPERATIVE REPORT   PREOPERATIVE DIAGNOSIS:  Left small finger middle phalanx intra- articular condyle fracture.  POSTOPERATIVE DIAGNOSIS:  Left small finger middle phalanx open intra- articular condyle fracture.  PROCEDURE:   1. Irrigation and debridement of left small finger middle phalanx open   Fracture 2. Open reduction and percutaneous pinning of left small finger middle   phalanx intra-articular condyle fracture  SURGEON:  Leanora Cover, MD  ASSISTANT:  None.  ANESTHESIA:  Regional.  IV FLUIDS:  Per anesthesia flow sheet.  ESTIMATED BLOOD LOSS:  Minimal.  COMPLICATIONS:  None.  SPECIMENS:  None.  TOURNIQUET TIME:  41 minutes.  DISPOSITION:  Stable to PACU.  INDICATIONS:  Mr. Vandevelde is a 53 year old right-hand-dominant male who was moving a recliner earlier today when the mechanism injured his left small finger.  He was seen in urgent care facility and followed up with [facility] where he was evaluated and found to have a fracture of his middle phalanx.  I was consulted for management of injury.  On examination, he had intact sensation and capillary refill in the fingertips.  He could flex and extend at the DIP joint.  He had lacerations both volarly and dorsally.  Radiographs showed a condyle fracture of the middle phalanx.  I recommended to Mr. Samek going to the operating room for irrigation and debridement of the wounds and fixation of fracture.  Risks, benefits, and alternatives of surgery were discussed including risk of blood loss, infection; damage to nerves, vessels, tendons, ligaments, bone; failure of surgery, need for additional  surgery, complications with wound healing, continued pain, nonunion, malunion, stiffness, and possible avascular necrosis with condyle fractures.  He voiced understanding of these risks and elected to proceed.  OPERATIVE COURSE:  After being identified preoperatively by myself, the patient and I agreed upon procedure and site of procedure.  Surgical site was marked.  The risks, benefits, and alternatives of surgery were reviewed and wished to proceed.  Surgical consent had been signed.  He was given IV Ancef as preoperative antibiotic coverage.  He was transferred to the operating room and placed on the operating room table in supine position with left upper extremity on arm board.  A regional block had been performed by Anesthesia in preoperative holding area. The left upper extremity was prepped and draped in normal sterile orthopedic fashion.  Surgical pause was performed between surgeons, anesthesia, operating staff, and all were in agreement as to the patient, procedure, and site of procedure.  Tourniquet at the proximal aspect of the extremity was inflated to 250 mmHg after exsanguination of the limb with an Esmarch bandage.  The wounds were explored.  The volar wound went down to the tendon but there was no tendon laceration noted. There was a small amount of gross contamination with dark gritty substance.  This was removed.  The dorsal wound was examined.  The extensor tendon was intact.  The condyle fracture was exposed in the wound.  The open fracture and both wounds were copiously irrigated with a 1000 mL of sterile saline by bulb syringe.  The condyle was manipulated through the wound into appropriate reduction.  The C-arm was used in AP and lateral projections throughout the case.  Two 0.028-inch K-wires were then used to stabilize the fracture.  One went transversely from the radial condyle into the ulnar condyle.  The 2nd went from the tip of the finger across the distal  phalanx and DIP joint to stabilize the joint. This was adequate to stabilize the fracture.  The wounds were closed with 5-0 Monocryl in an interrupted fashion.  This provided good apposition of tissues.  The wounds and pin sites were then dressed with sterile Xeroform and 4x4s, and wrapped with a Kerlix bandage.  A volar splint was placed including the long, ring, and small fingers with the MPs flexed and the IPs extended.  This was wrapped with Kerlix and Ace bandage.  Tourniquet was deflated at 41 minutes.  Fingertips were pink with brisk capillary refill after deflation of tourniquet.  The operative drapes were broken down and the patient was awoken from anesthesia safely.  He was transferred back to stretcher and taken to PACU in stable condition.  I will see him back in the office in 1 week for postoperative followup.  I will give him Percocet 5/325, 1-2 p.o. q.6 hours p.r.n. pain, dispense #40 and Bactrim DS 1 p.o. b.i.d. x7 days.     Leanora Cover, MD     KK/MEDQ  D:  05/09/2014  T:  05/10/2014  Job:  [id number]

## 2014-05-17 ENCOUNTER — Other Ambulatory Visit (HOSPITAL_COMMUNITY): Payer: BC Managed Care – PPO

## 2014-05-21 ENCOUNTER — Ambulatory Visit (HOSPITAL_COMMUNITY): Payer: BC Managed Care – PPO | Attending: Family Medicine | Admitting: Radiology

## 2014-05-21 VITALS — BP 130/63 | HR 68 | Ht 75.0 in | Wt 250.0 lb

## 2014-05-21 DIAGNOSIS — R9439 Abnormal result of other cardiovascular function study: Secondary | ICD-10-CM | POA: Insufficient documentation

## 2014-05-21 DIAGNOSIS — R42 Dizziness and giddiness: Secondary | ICD-10-CM | POA: Insufficient documentation

## 2014-05-21 DIAGNOSIS — Z8249 Family history of ischemic heart disease and other diseases of the circulatory system: Secondary | ICD-10-CM | POA: Diagnosis not present

## 2014-05-21 DIAGNOSIS — R0609 Other forms of dyspnea: Secondary | ICD-10-CM | POA: Insufficient documentation

## 2014-05-21 DIAGNOSIS — R079 Chest pain, unspecified: Secondary | ICD-10-CM

## 2014-05-21 DIAGNOSIS — R5383 Other fatigue: Secondary | ICD-10-CM | POA: Diagnosis not present

## 2014-05-21 DIAGNOSIS — R0602 Shortness of breath: Secondary | ICD-10-CM

## 2014-05-21 DIAGNOSIS — R0989 Other specified symptoms and signs involving the circulatory and respiratory systems: Secondary | ICD-10-CM | POA: Insufficient documentation

## 2014-05-21 DIAGNOSIS — R5381 Other malaise: Secondary | ICD-10-CM | POA: Insufficient documentation

## 2014-05-21 MED ORDER — TECHNETIUM TC 99M SESTAMIBI GENERIC - CARDIOLITE
11.0000 | Freq: Once | INTRAVENOUS | Status: AC | PRN
  Administered 2014-05-21: 11 via INTRAVENOUS

## 2014-05-21 MED ORDER — REGADENOSON 0.4 MG/5ML IV SOLN
0.4000 mg | Freq: Once | INTRAVENOUS | Status: AC
  Administered 2014-05-21: 0.4 mg via INTRAVENOUS

## 2014-05-21 MED ORDER — TECHNETIUM TC 99M SESTAMIBI GENERIC - CARDIOLITE
33.0000 | Freq: Once | INTRAVENOUS | Status: AC | PRN
  Administered 2014-05-21: 33 via INTRAVENOUS

## 2014-05-21 NOTE — Progress Notes (Signed)
[facility] [address] [place], [place] [address] [phone]    Cardiology Nuclear Med Study  Matthew Johnson is a 53 y.o. male     MRN : [id number]     DOB: May 04, 1961  Procedure Date: 05/21/2014  Nuclear Med Background Indication for Stress Test:  Evaluation for Ischemia History:  No H/O CAD Cardiac Risk Factors: Family History - CAD, History of Smoking, Hypertension and Lipids  Symptoms:  Dizziness, DOE and Fatigue   Nuclear Pre-Procedure Caffeine/Decaff Intake:  None NPO After: 8:00pm   Lungs:  clear O2 Sat: 97% on room air. IV 0.9% NS with Angio Cath:  20g  IV Site: R Antecubital  IV Started by:  Perrin Maltese, EMT-P  Chest Size (in):  50 Cup Size: n/a  Height: 6\' 3"  (1.905 m)  Weight:  250 lb (113.399 kg)  BMI:  Body mass index is 31.25 kg/(m^2). Tech Comments:  Rx this am    Nuclear Med Study 1 or 2 day study: 1 day  Stress Test Type:  Carlton Adam  Reading MD: n/a  Order Authorizing Provider:  R.Maceo Pro MD  Resting Radionuclide: Technetium 71m Sestamibi  Resting Radionuclide Dose: 11.0 mCi   Stress Radionuclide:  Technetium 31m Sestamibi  Stress Radionuclide Dose: 33.0 mCi           Stress Protocol Rest HR: 68 Stress HR: 78  Rest BP: 130/63 Stress BP: 129/56  Exercise Time (min): n/a METS: n/a           Dose of Adenosine (mg):  n/a Dose of Lexiscan: 0.4 mg  Dose of Atropine (mg): n/a Dose of Dobutamine: n/a mcg/kg/min (at max HR)  Stress Test Technologist: Glade Lloyd, BS-ES  Nuclear Technologist:  Annye Rusk, CNMT     Rest Procedure:  Myocardial perfusion imaging was performed at rest 45 minutes following the intravenous administration of Technetium 28m Sestamibi. Rest ECG: NSR poor R wave progression  Stress Procedure:  The patient received IV Lexiscan 0.4 mg over 15-seconds.  Technetium 43m Sestamibi injected at 30-seconds.  Quantitative spect images were obtained after a 45 minute delay. Stress ECG: No significant  change from baseline ECG  QPS Raw Data Images:  Significant motion and gut artifact Stress Images:  Normal homogeneous uptake in all areas of the myocardium. Rest Images:  There is decreased uptake in the inferior wall. Subtraction (SDS):  Artifact likely Transient Ischemic Dilatation (Normal <1.22):  0.95 Lung/Heart Ratio (Normal <0.45):  0.46  Quantitative Gated Spect Images QGS EDV:  127 ml QGS ESV:  42 ml  Impression Exercise Capacity:  Lexiscan with no exercise. BP Response:  Normal blood pressure response. Clinical Symptoms:  No significant symptoms noted. ECG Impression:  No significant ST segment change suggestive of ischemia. Comparison with Prior Nuclear Study: No images to compare  Overall Impression:  Low risk stress nuclear study Possible small area of inferior reversibility but likely normal given the degree of motion and bowel artifact.  LV Ejection Fraction: 67%.  LV Wall Motion:  NL LV Function; NL Wall Motion   Matthew Johnson

## 2014-05-22 NOTE — Addendum Note (Signed)
Addended by: Briscoe Burns on: 05/22/2014 08:34 AM   Modules accepted: Orders

## 2014-11-19 ENCOUNTER — Other Ambulatory Visit (HOSPITAL_BASED_OUTPATIENT_CLINIC_OR_DEPARTMENT_OTHER): Payer: Self-pay | Admitting: Family Medicine

## 2014-11-19 DIAGNOSIS — R1012 Left upper quadrant pain: Secondary | ICD-10-CM

## 2014-11-20 ENCOUNTER — Ambulatory Visit (HOSPITAL_BASED_OUTPATIENT_CLINIC_OR_DEPARTMENT_OTHER): Payer: BLUE CROSS/BLUE SHIELD

## 2015-05-28 ENCOUNTER — Other Ambulatory Visit (HOSPITAL_BASED_OUTPATIENT_CLINIC_OR_DEPARTMENT_OTHER): Payer: Self-pay | Admitting: Family Medicine

## 2015-05-28 DIAGNOSIS — R1012 Left upper quadrant pain: Secondary | ICD-10-CM

## 2015-05-28 DIAGNOSIS — R748 Abnormal levels of other serum enzymes: Secondary | ICD-10-CM

## 2015-05-29 ENCOUNTER — Ambulatory Visit (HOSPITAL_BASED_OUTPATIENT_CLINIC_OR_DEPARTMENT_OTHER)
Admission: RE | Admit: 2015-05-29 | Discharge: 2015-05-29 | Disposition: A | Discharge status: Home / Self Care | Payer: BLUE CROSS/BLUE SHIELD | Source: Ambulatory Visit | Attending: Family Medicine | Admitting: Family Medicine

## 2015-05-29 DIAGNOSIS — R932 Abnormal findings on diagnostic imaging of liver and biliary tract: Secondary | ICD-10-CM | POA: Diagnosis not present

## 2015-05-29 DIAGNOSIS — R1012 Left upper quadrant pain: Secondary | ICD-10-CM | POA: Diagnosis present

## 2015-05-29 DIAGNOSIS — R748 Abnormal levels of other serum enzymes: Secondary | ICD-10-CM | POA: Diagnosis not present

## 2015-05-29 DIAGNOSIS — R938 Abnormal findings on diagnostic imaging of other specified body structures: Secondary | ICD-10-CM | POA: Diagnosis not present

## 2015-05-29 MED ORDER — IOHEXOL 300 MG/ML  SOLN
100.0000 mL | Freq: Once | INTRAMUSCULAR | Status: AC | PRN
  Administered 2015-05-29: 100 mL via INTRAVENOUS

## 2016-04-25 IMAGING — CT CT ABDOMEN W/ CM
2 of 5 series · 16 of 46 positions shown, 18 images · IV contrast (APPLIED)
Comparison: Ultrasound 12/31/2014, CT 03/21/2013

CLINICAL DATA: LEFT upper quadrant pain, elevated liver enzymes.
Abnormal ultrasound

EXAM:
CT ABDOMEN WITH CONTRAST
TECHNIQUE: Multidetector CT imaging of the abdomen was performed using the
standard protocol following bolus administration of intravenous
contrast.
CONTRAST:  100mL OMNIPAQUE IOHEXOL 300 MG/ML  SOLN

[Series 2: abd/pelvis 5.0 b31f · axial · 0.91mm/px · z∈[-334,+141]mm · 13 of 107 slices shown, 15 images]
[im 6/107  soft-tissue]
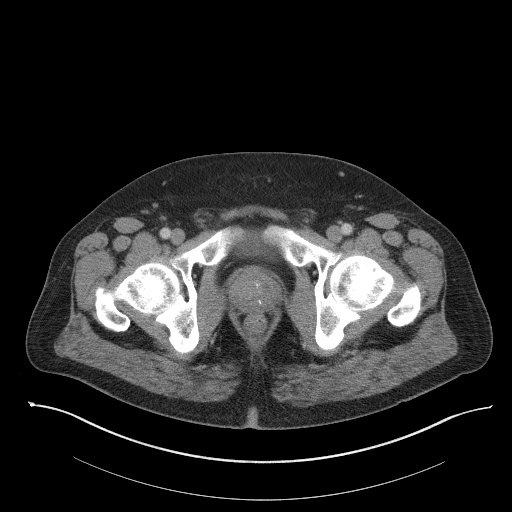
[im 6/107  bone]
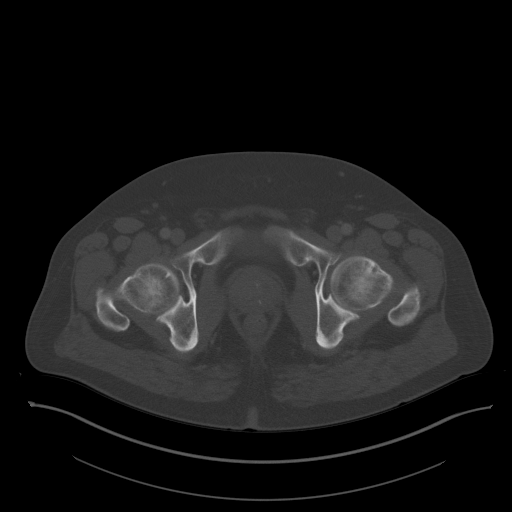
[im 12/107  soft-tissue]
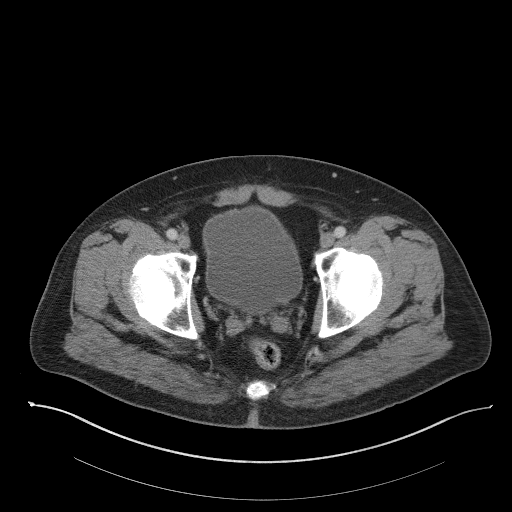
[im 24/107  soft-tissue]
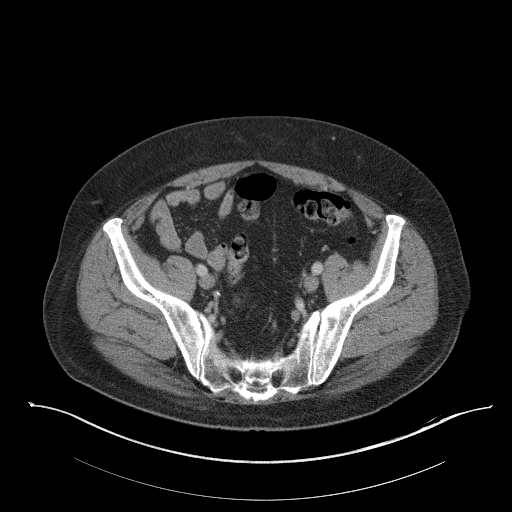
[im 30/107  soft-tissue]
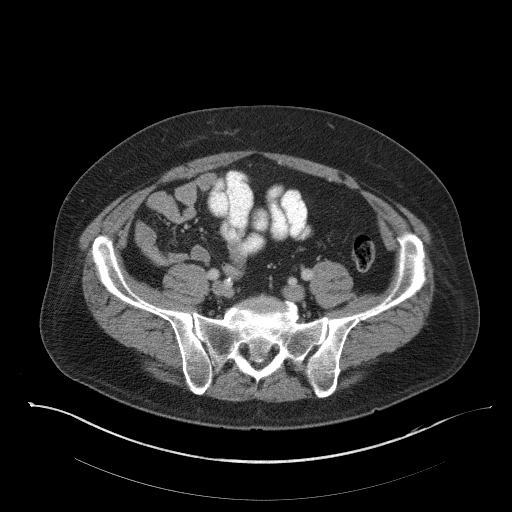
[im 36/107  soft-tissue]
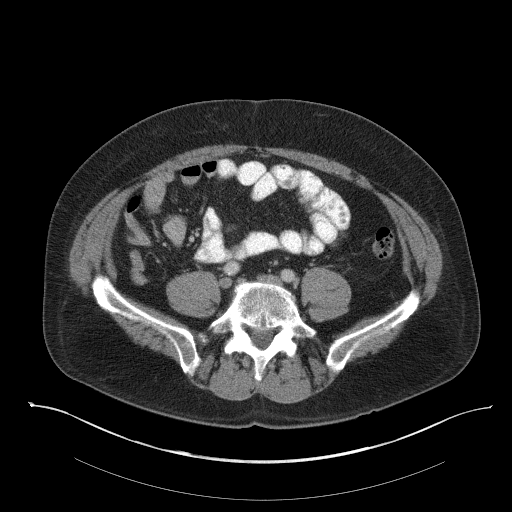
[im 48/107  soft-tissue]
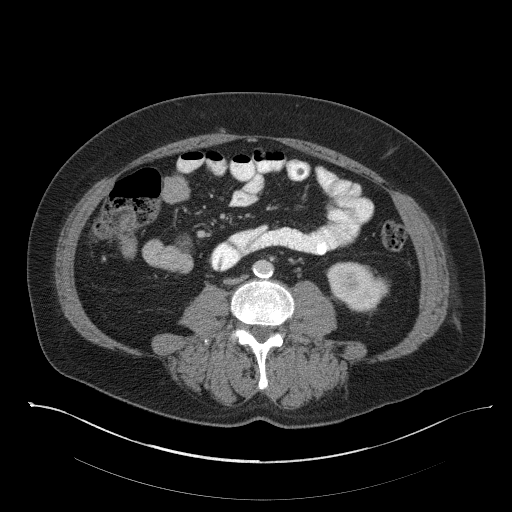
[im 54/107  soft-tissue]
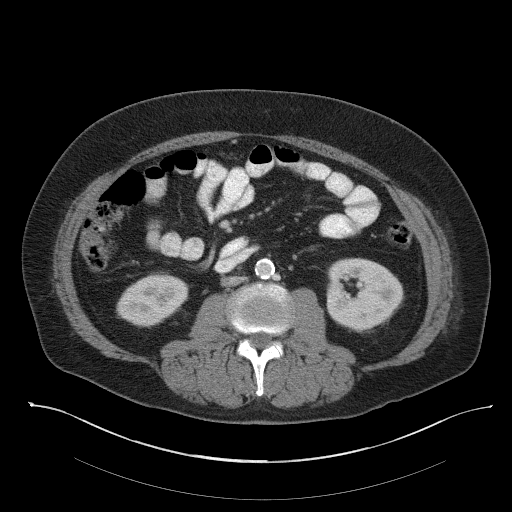
[im 59/107  soft-tissue]
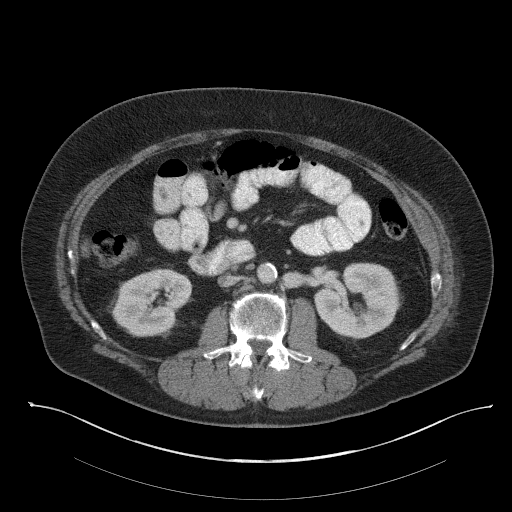
[im 71/107  soft-tissue]
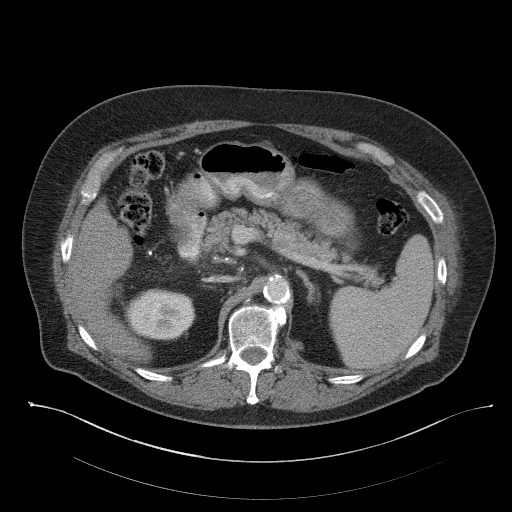
[im 71/107  bone]
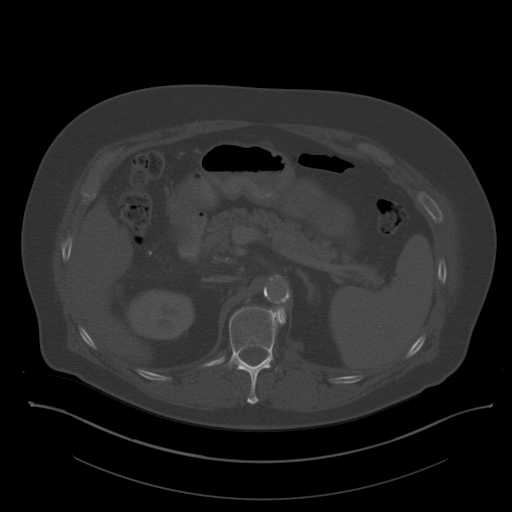
[im 77/107  soft-tissue]
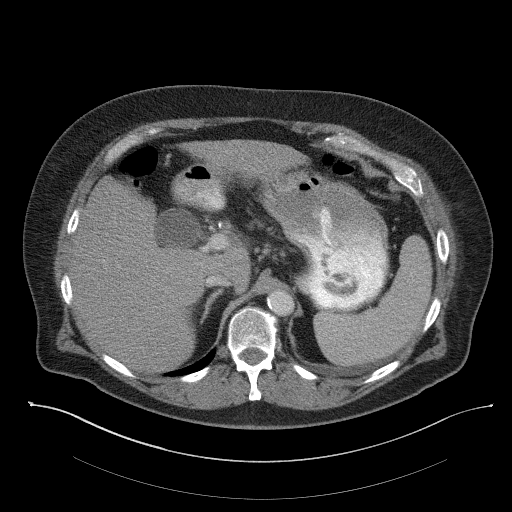
[im 83/107  soft-tissue]
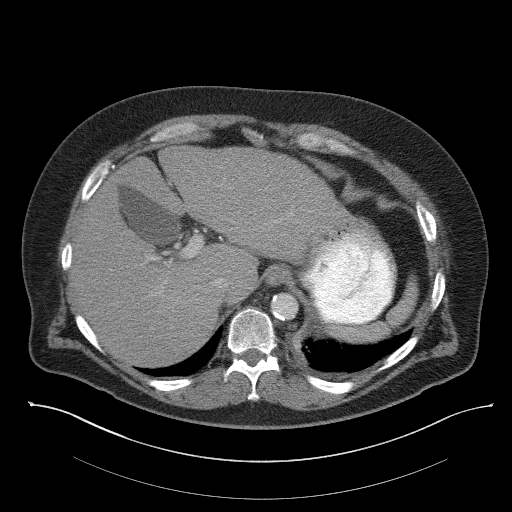
[im 95/107  soft-tissue]
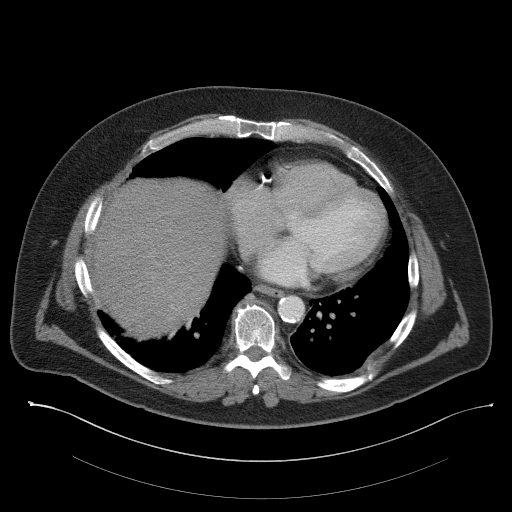
[im 101/107  soft-tissue]
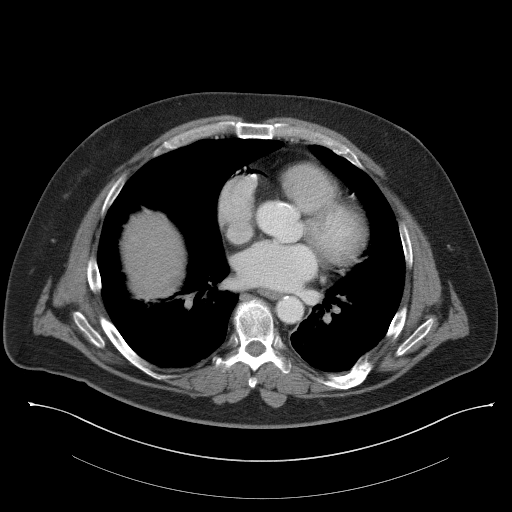

[Series 5: abd/pelvis 3.0 coronal · coronal · 0.88mm/px · 3 of 110 slices shown]
[im 37/110  soft-tissue]
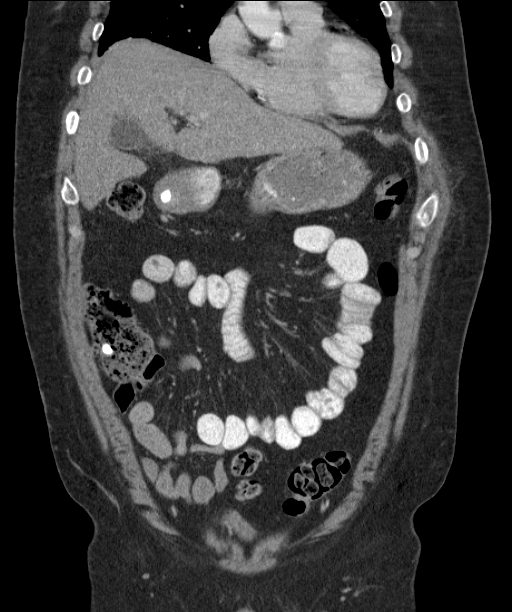
[im 49/110  soft-tissue]
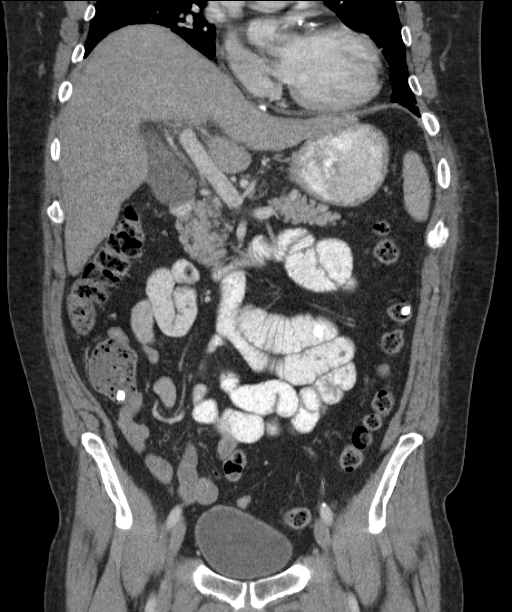
[im 61/110  soft-tissue]
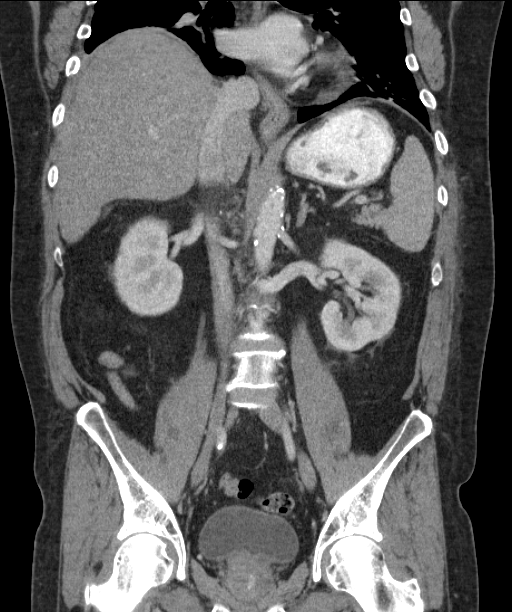

[16 of 46 positions shown; findings below may reference images not displayed]

FINDINGS: Lower chest: Lung bases are clear.

Hepatobiliary: The fine nodular contour of the liver. For example
along the the LEFT lateral hepatic lobe on image 30, series 2. No
biliary duct dilatation. Gallbladder is normal.

Pancreas: Pancreas is normal. No ductal dilatation. No pancreatic
inflammation.

Spleen: Normal spleen

Adrenals/urinary tract: Adrenal glands and kidneys are normal. The
ureters and bladder normal.

Stomach/Bowel: Stomach, small bowel, appendix, and cecum are normal.
The colon and rectosigmoid colon are normal.

Vascular/Lymphatic: Abdominal aorta is normal caliber with
atherosclerotic calcification. There is no retroperitoneal or
periportal lymphadenopathy. No pelvic lymphadenopathy.

Reproductive: Prostate normal

Musculoskeletal: No aggressive osseous lesion.

Other: No free fluid.  No ascites
IMPRESSION: 1. Fine nodular contour of liver is concerning for underlying early
cirrhosis.
2. No biliary duct dilatation or focal hepatic lesion.

## 2016-07-14 ENCOUNTER — Other Ambulatory Visit: Payer: Self-pay | Admitting: Urology

## 2016-07-14 DIAGNOSIS — C61 Malignant neoplasm of prostate: Secondary | ICD-10-CM

## 2016-07-21 ENCOUNTER — Encounter (HOSPITAL_COMMUNITY)
Admission: RE | Admit: 2016-07-21 | Discharge: 2016-07-21 | Disposition: A | Discharge status: Home / Self Care | Payer: BLUE CROSS/BLUE SHIELD | Source: Ambulatory Visit | Attending: Urology | Admitting: Urology

## 2016-07-21 DIAGNOSIS — C61 Malignant neoplasm of prostate: Secondary | ICD-10-CM | POA: Insufficient documentation

## 2016-07-21 MED ORDER — TECHNETIUM TC 99M MEDRONATE IV KIT
20.3000 | PACK | Freq: Once | INTRAVENOUS | Status: AC | PRN
  Administered 2016-07-21: 20.3 via INTRAVENOUS

## 2016-08-30 ENCOUNTER — Encounter: Payer: Self-pay | Admitting: Medical Oncology

## 2016-08-30 ENCOUNTER — Telehealth: Payer: Self-pay | Admitting: Medical Oncology

## 2016-08-30 NOTE — Telephone Encounter (Signed)
Left a message requesting a return call to discuss his  referral to the Prostate [facility] [facility].

## 2016-09-01 ENCOUNTER — Encounter: Payer: Self-pay | Admitting: Radiation Oncology

## 2016-09-01 NOTE — Progress Notes (Signed)
GU Location of Tumor / Histology: prostatic adenocarcinoma  If Prostate Cancer, Gleason Score is (3 + 4) and PSA is (51.70) September 2017  Juanda Crumble Jeremiah was originally referred by Dr. Marin Comment to Dr. Diona Fanti for evaluation of an elevated PSA.  Biopsies of prostate (if applicable) revealed:    Past/Anticipated interventions by urology, if any: biopsy, referral to [facility]  Past/Anticipated interventions by medical oncology, if any: to be evaluated by Dr. Alen Blew in [facility]  Weight changes, if any: no  Bowel/Bladder complaints, if any: Frequency, urgency, hesitancy, nocturia x 3, and weak stream   Nausea/Vomiting, if any: no  Pain issues, if any:  no  SAFETY ISSUES:  Prior radiation? no  Pacemaker/ICD? no  Possible current pregnancy? no  Is the patient on methotrexate? no  Current Complaints / other details:  55 year old male.

## 2016-09-02 ENCOUNTER — Telehealth: Payer: Self-pay | Admitting: Medical Oncology

## 2016-09-02 NOTE — Telephone Encounter (Signed)
Confirmed appointment for Prostate [facility] 09/02/16 arriving at 7:45am

## 2016-09-03 ENCOUNTER — Encounter: Payer: Self-pay | Admitting: Medical Oncology

## 2016-09-03 ENCOUNTER — Encounter: Payer: Self-pay | Admitting: Radiation Oncology

## 2016-09-03 ENCOUNTER — Ambulatory Visit
Admission: RE | Admit: 2016-09-03 | Discharge: 2016-09-03 | Disposition: A | Discharge status: Home / Self Care | Payer: BLUE CROSS/BLUE SHIELD | Source: Ambulatory Visit | Attending: Radiation Oncology | Admitting: Radiation Oncology

## 2016-09-03 ENCOUNTER — Other Ambulatory Visit: Payer: Self-pay | Admitting: Radiation Oncology

## 2016-09-03 ENCOUNTER — Ambulatory Visit (HOSPITAL_BASED_OUTPATIENT_CLINIC_OR_DEPARTMENT_OTHER): Payer: BLUE CROSS/BLUE SHIELD | Admitting: Oncology

## 2016-09-03 VITALS — BP 116/61 | HR 68 | Resp 18 | Ht 76.0 in | Wt 253.8 lb

## 2016-09-03 DIAGNOSIS — C61 Malignant neoplasm of prostate: Secondary | ICD-10-CM

## 2016-09-03 DIAGNOSIS — E7800 Pure hypercholesterolemia, unspecified: Secondary | ICD-10-CM | POA: Diagnosis not present

## 2016-09-03 DIAGNOSIS — Z7982 Long term (current) use of aspirin: Secondary | ICD-10-CM | POA: Diagnosis not present

## 2016-09-03 DIAGNOSIS — K219 Gastro-esophageal reflux disease without esophagitis: Secondary | ICD-10-CM | POA: Diagnosis not present

## 2016-09-03 DIAGNOSIS — I10 Essential (primary) hypertension: Secondary | ICD-10-CM | POA: Insufficient documentation

## 2016-09-03 DIAGNOSIS — M109 Gout, unspecified: Secondary | ICD-10-CM | POA: Insufficient documentation

## 2016-09-03 DIAGNOSIS — Z87891 Personal history of nicotine dependence: Secondary | ICD-10-CM | POA: Insufficient documentation

## 2016-09-03 HISTORY — DX: Malignant neoplasm of prostate: C61

## 2016-09-03 LAB — COMPREHENSIVE METABOLIC PANEL
ALT: 52 U/L (ref 0–55)
AST: 57 U/L — ABNORMAL HIGH (ref 5–34)
Albumin: 4.1 g/dL (ref 3.5–5.0)
Alkaline Phosphatase: 134 U/L (ref 40–150)
Anion Gap: 18 mEq/L — ABNORMAL HIGH (ref 3–11)
BUN: 22.6 mg/dL (ref 7.0–26.0)
CO2: 21 meq/L — AB (ref 22–29)
Calcium: 9.7 mg/dL (ref 8.4–10.4)
Chloride: 107 mEq/L (ref 98–109)
Creatinine: 1.8 mg/dL — ABNORMAL HIGH (ref 0.7–1.3)
EGFR: 40 mL/min/{1.73_m2} — AB (ref >90)
Glucose: 84 mg/dl (ref 70–140)
Potassium: 5.4 mEq/L — ABNORMAL HIGH (ref 3.5–5.1)
SODIUM: 146 meq/L — AB (ref 136–145)
Total Bilirubin: 0.9 mg/dL (ref 0.20–1.20)
Total Protein: 8.9 g/dL — ABNORMAL HIGH (ref 6.4–8.3)

## 2016-09-03 NOTE — Progress Notes (Signed)
Reason for Referral: Prostate cancer.   HPI: As a pleasant 55 year old gentleman who is in reasonable health with a past medical history of hypertension and hyperlipidemia. He does report intermittent alcohol use although he denies heavy drinking. He was noted to have increased frequency in urination noted by his primary care provider. He had a PSA checked in August 2017 and was elevated at 81.47 with an abnormal digital rectal exam. Based on these findings he was evaluated by Dr. Diona Fanti and a repeat PSA at that time was 51. Prostate biopsy obtained on 07/07/2016 showed Gleason score of 3+4 = 7 in the majority of 6 cores. Staging workup including CT scan and a bone scan did not show any evidence of metastatic disease. He reports feeling reasonably well without any recent complaints. He does report intermittent frequency and nocturia but for the most part asymptomatic. He remains reasonably active and attends to activities of daily living.  He does not report any headaches, blurry vision, syncope or seizures. He does not report any fevers or chills or sweats. He does not report any cough, wheezing or hemoptysis. He is not report any chest pain, palpitation, orthopnea or leg edema. He does not report any nausea, vomiting or abdominal pain. He does not report any frequency, urgency or hesitancy. He does not report any skeletal complaints. Remaining review of systems unremarkable.   Past Medical History:  Diagnosis Date  . Acid reflux   . Gout   . High cholesterol   . Hypertension   . Prostate cancer [facility])   :  Past Surgical History:  Procedure Laterality Date  . APPENDECTOMY  age 6  . ORIF ANKLE FRACTURE    . PERCUTANEOUS PINNING Left 05/09/2014   Procedure: Closed Reduction Percutaneous Pinning Left Small Finger;  Surgeon: Leanora Cover, MD;  Location: [place];  Service: Orthopedics;  Laterality: Left;  . PROSTATE BIOPSY    . SKIN GRAFT  1988   right left  :   Current Outpatient  Prescriptions:  .  aspirin EC 81 MG tablet, Take 81 mg by mouth daily., Disp: , Rfl:  .  Aspirin-Salicylamide-Caffeine (BC HEADACHE POWDER PO), Take by mouth., Disp: , Rfl:  .  Carboxymethylcellul-Glycerin (CLEAR EYES FOR DRY EYES OP), Place 3 drops into both eyes daily., Disp: , Rfl:  .  diltiazem (TIAZAC) 240 MG 24 hr capsule, Take 240 mg by mouth daily., Disp: , Rfl:  .  glucosamine-chondroitin 500-400 MG tablet, Take 1 tablet by mouth 3 (three) times daily., Disp: , Rfl:  .  indomethacin (INDOCIN SR) 75 MG CR capsule, Take 75 mg by mouth 2 (two) times daily as needed for moderate pain. , Disp: , Rfl:  .  lovastatin (MEVACOR) 20 MG tablet, Take 20 mg by mouth at bedtime., Disp: , Rfl:  .  Multiple Vitamins-Minerals (MULTIVITAMIN PO), Take 1 tablet by mouth every morning., Disp: , Rfl:  .  Omega-3 Fatty Acids (FISH OIL) 1200 MG CAPS, Take 4,800 mg by mouth every morning., Disp: , Rfl:  .  oxyCODONE-acetaminophen (PERCOCET) 5-325 MG per tablet, 1-2 tabs po q6 hours prn pain, Disp: 40 tablet, Rfl: 0 .  potassium chloride SA (K-DUR,KLOR-CON) 20 MEQ tablet, Take 20 mEq by mouth 2 (two) times daily., Disp: , Rfl:  .  RABEprazole (ACIPHEX) 20 MG tablet, Take 20 mg by mouth daily as needed (for heart burn). , Disp: , Rfl:  .  sulfamethoxazole-trimethoprim (BACTRIM DS) 800-160 MG per tablet, Take 1 tablet by mouth 2 (two) times daily.,  Disp: 14 tablet, Rfl: 0 .  valsartan-hydrochlorothiazide (DIOVAN-HCT) 160-25 MG per tablet, Take 1 tablet by mouth daily., Disp: , Rfl:  .  vitamin B-12 (CYANOCOBALAMIN) 1000 MCG tablet, Take 1,000 mcg by mouth daily., Disp: , Rfl: :  No Known Allergies:  Family History  Problem Relation Age of Onset  . Hypertension Mother   . Heart attack Father   :  Social History   Social History  . Marital status: Married    Spouse name: N/A  . Number of children: N/A  . Years of education: N/A   Occupational History  . Not on file.   Social History Main Topics  .  Smoking status: Former Smoker    Types: Cigarettes    Quit date: 05/28/1992  . Smokeless tobacco: Never Used  . Alcohol use Yes     Comment: reports sometimes a whole lot  . Drug use: No  . Sexual activity: Not on file   Other Topics Concern  . Not on file   Social History Narrative  . No narrative on file  :  Pertinent items are noted in HPI.  Exam: ECOG 0.  General appearance: alert and cooperative appeared without distress. Head: Normocephalic, without obvious abnormality Throat: lips, mucosa, and tongue normal; teeth and gums normal Neck: no adenopathy Back: negative Resp: clear to auscultation bilaterally Cardio: regular rate and rhythm, S1, S2 normal, no murmur, click, rub or gallop GI: soft, non-tender; bowel sounds normal; no masses,  no organomegaly Extremities: extremities normal, atraumatic, no cyanosis or edema  CBC    Component Value Date/Time   WBC 8.9 05/09/2014 1725   RBC 4.22 05/09/2014 1725   HGB 14.5 05/09/2014 1725   HCT 40.3 05/09/2014 1725   PLT 152 05/09/2014 1725   MCV 95.5 05/09/2014 1725   MCH 34.4 (H) 05/09/2014 1725   MCHC 36.0 05/09/2014 1725   RDW 15.5 05/09/2014 1725   LYMPHSABS 1.4 06/23/2013 1920   MONOABS 0.9 06/23/2013 1920   EOSABS 0.0 06/23/2013 1920   BASOSABS 0.1 06/23/2013 1920     Assessment and Plan:   55 year old gentleman with prostate cancer diagnosed in October 2017. His PSA was elevated at 81 and subsequently was repeated and down to 51. His Gleason score 3+4 = 7 without any evidence of metastatic disease.  His case was discussed today in the prostate cancer multidisciplinary clinic. His imaging studies were reviewed with radiology and his pathology specimen was discussed with the reviewing pathologist. Options of therapy were reviewed today with the patient as well as the natural course of this disease. He understands that without treatment this cancer can spread and can lead to an incurable malignancy and ultimately  more morbidity and possibly death. Treatment options can be curative in this particular setting including primary surgical therapy versus definitive radiation. These options were reviewed today and he is unsure which way to proceed. He will gather information today and make a decision in the near future.  All his questions were answered today to his satisfaction.

## 2016-09-03 NOTE — Progress Notes (Signed)
                               Care Plan Summary  Name: Quaran Quertermous DOB: 07-05-1961   Your Medical Team:   Urologist -  Dr. Raynelle Bring, [facility]  Radiation Oncologist - Dr. Tyler Pita, [facility]   Medical Oncologist - Dr. Zola Button, [place]  Recommendations: 1) Radiation Therapy 2) Surgery- Robotic Prostatecomy   * These recommendations are based on information available as of today's consult.      Recommendations may change depending on the results of further tests or exams.  Next Steps: 1) PSA recheck-today 2) Go home and consider options call Cira Rue, RN ;with decision   When appointments need to be scheduled, you will be contacted by [facility] and/or [facility].  Questions?  Please do not hesitate to call Cira Rue, RN, BSN, OCN at [phone] any questions or concerns.  Shirlean Mylar is your Oncology Nurse Navigator and is available to assist you while you're receiving your medical care at [facility].

## 2016-09-03 NOTE — Progress Notes (Signed)
Radiation Oncology         [phone] ________________________________  Multidisciplinary Prostate Cancer Clinic  Initial Radiation Oncology Consultation  Name: Matthew Johnson MRN: [id number]  Date: 09/03/2016  DOB: 05/05/1961  [id number], Jaymes Graff, MD  Raynelle Bring, MD   REFERRING PHYSICIAN: Raynelle Bring, MD  DIAGNOSIS: 55 y.o. 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 Malignant neoplasm of prostate ([place]) Horace ILLNESS::Matthew Johnson is a 55 y.o. gentleman.  He was noted to have an elevated PSA of 81.47 by his primary care physician, Dr. Marin Comment.  Accordingly, he was referred for evaluation in urology by Dr. Diona Fanti on 06/02/16,  digital rectal examination was performed at that time revealing no nodules.  The patient proceeded to transrectal ultrasound with 12 biopsies of the prostate on 07/07/16.  The prostate volume measured 55.32 cc.  Out of 12 core biopsies, 6 were positive.  The maximum Gleason score was 3+7, and this was seen in the left apex, right mid, right apex, right base lateral, and right mid lateral. 3+3 was seen in the right apex lateral.  Bone scan on 07/21/16 revealed contiguous foci of abnormal uptake in the ribs bilaterally consistent with rib fractures and a linear focus of abnormal uptake in the sternoxiphoid joint lilkley due to degenerative change or a old fracture (although metastatic focus cannot be excluded). Of note, the patient did report [place] and broke 4 of his ribs.  CT scan of the pelvis on 07/21/16 was negative for evidence of metastatic disease.  PSA 05/25/16: 81.47 06/02/16: 51.7  The patient reviewed the biopsy results with his urologist and he has kindly been referred today to the multidisciplinary prostate cancer clinic for presentation of pathology and radiology studies in our  conference for discussion of potential radiation treatment options and clinical evaluation.   PREVIOUS RADIATION THERAPY: No  PAST MEDICAL HISTORY:  has a past medical history of Acid reflux; Gout; High cholesterol; Hypertension; Malignant neoplasm of prostate ([place]) (09/03/2016); and Prostate cancer ([place]).    PAST SURGICAL HISTORY: Past Surgical History:  Procedure Laterality Date  . APPENDECTOMY  age 48  . ORIF ANKLE FRACTURE    . PERCUTANEOUS PINNING Left 05/09/2014   Procedure: Closed Reduction Percutaneous Pinning Left Small Finger;  Surgeon: Leanora Cover, MD;  Location: [place];  Service: Orthopedics;  Laterality: Left;  . PROSTATE BIOPSY    . SKIN GRAFT  1988   right left    FAMILY HISTORY: family history includes Heart attack in his father; Hypertension in his mother.  SOCIAL HISTORY:  reports that he quit smoking about 24 years ago. His smoking use included Cigarettes. He has never used smokeless tobacco. He reports that he drinks alcohol. He reports that he does not use drugs.  ALLERGIES: Patient has no known allergies.  MEDICATIONS:  Current Outpatient Prescriptions  Medication Sig Dispense Refill  . aspirin EC 81 MG tablet Take 81 mg by mouth daily.    . Aspirin-Salicylamide-Caffeine (BC HEADACHE POWDER PO) Take by mouth.    Marland Kitchen glucosamine-chondroitin 500-400 MG tablet Take 1 tablet by mouth 3 (three) times daily.    . potassium chloride SA (K-DUR,KLOR-CON) 20 MEQ tablet Take 20 mEq by mouth 2 (two) times daily.    . vitamin B-12 (CYANOCOBALAMIN) 1000 MCG tablet  Take 1,000 mcg by mouth daily.    . Carboxymethylcellul-Glycerin (CLEAR EYES FOR DRY EYES OP) Place 3 drops into both eyes daily.    Marland Kitchen diltiazem (TIAZAC) 240 MG 24 hr capsule Take 240 mg by mouth daily.    . indomethacin (INDOCIN SR) 75 MG CR capsule Take 75 mg by mouth 2 (two) times daily as needed for moderate pain.     Marland Kitchen lovastatin (MEVACOR) 20 MG tablet Take 20 mg by mouth at bedtime.    . Multiple  Vitamins-Minerals (MULTIVITAMIN PO) Take 1 tablet by mouth every morning.    . Omega-3 Fatty Acids (FISH OIL) 1200 MG CAPS Take 4,800 mg by mouth every morning.    Marland Kitchen oxyCODONE-acetaminophen (PERCOCET) 5-325 MG per tablet 1-2 tabs po q6 hours prn pain 40 tablet 0  . RABEprazole (ACIPHEX) 20 MG tablet Take 20 mg by mouth daily as needed (for heart burn).     Marland Kitchen sulfamethoxazole-trimethoprim (BACTRIM DS) 800-160 MG per tablet Take 1 tablet by mouth 2 (two) times daily. 14 tablet 0  . valsartan-hydrochlorothiazide (DIOVAN-HCT) 160-25 MG per tablet Take 1 tablet by mouth daily.     No current facility-administered medications for this encounter.     REVIEW OF SYSTEMS:  A 15 point review of systems is documented in the electronic medical record. This was obtained by the nursing staff. However, I reviewed this with the patient to discuss relevant findings and make appropriate changes.  Pertinent items noted in HPI and remainder of comprehensive ROS otherwise negative..  The patient completed an IPSS and IIEF questionnaire.  His IPSS score was 9 indicating moderate urinary outflow obstructive symptoms.  He indicated that his erectile function is able to complete sexual activity on almost always to all attempts.  Per urology:    GU Review Male:   Patient denies frequent urination, hard to postpone urination, burning/ pain with urination, get up at night to urinate, leakage of urine, stream starts and stops, trouble starting your streams, and have to strain to urinate .  Gastrointestinal (Lower):   Patient denies diarrhea and constipation.  Gastrointestinal (Upper):   Patient denies nausea and vomiting.  Constitutional:   Patient denies fever, night sweats, weight loss, and fatigue.  Skin:   Patient denies itching and skin rash/ lesion.  Eyes:   Patient denies blurred vision and double vision.  Ears/ Nose/ Throat:   Patient denies sore throat and sinus problems.  Hematologic/Lymphatic:   Patient denies  swollen glands and easy bruising.  Cardiovascular:   Patient denies leg swelling and chest pains.  Respiratory:   Patient denies cough and shortness of breath.  Endocrine:   Patient denies excessive thirst.  Musculoskeletal:   Patient denies back pain and joint pain.  Neurological:   Patient denies headaches and dizziness.  Psychologic:   Patient denies depression and anxiety.     PHYSICAL EXAM: This patient is in no acute distress.  He is alert and oriented.   height is 6\' 4"  (1.93 m) and weight is 253 lb 12.8 oz (115.1 kg). His blood pressure is 116/61 and his pulse is 68. His respiration is 18 and oxygen saturation is 100%.  He exhibits no respiratory distress or labored breathing.  He appears neurologically intact.  His mood is pleasant.  His affect is appropriate.  Please note the digital rectal exam findings described above. Per urology:  GU PHYSICAL EXAMINATION:    Prostate: 50 cc without nodularity or induration noted.   MULTI-SYSTEM PHYSICAL EXAMINATION:  Constitutional: Well-nourished. No physical deformities. Normally developed. Good grooming.  Neck: Neck symmetrical, not swollen. Normal tracheal position.  Respiratory: No labored breathing, no use of accessory muscles. Clear bilaterally.  Cardiovascular: Normal temperature, normal extremity pulses, no swelling, no varicosities. Regular rate and rhythm.  Lymphatic: No enlargement of neck, axillae, groin.  Skin: No paleness, no jaundice, no cyanosis. No lesion, no ulcer, no rash.  Neurologic / Psychiatric: Oriented to time, oriented to place, oriented to person. No depression, no anxiety, no agitation.  Gastrointestinal: No mass, no tenderness, no rigidity, non obese abdomen. Right lower quadrant scar well-healed.  Eyes: Normal conjunctivae. Normal eyelids.  Ears, Nose, Mouth, and Throat: Left ear no scars, no lesions, no masses. Right ear no scars, no lesions, no masses. Nose no scars, no lesions, no masses. Normal hearing.  Normal lips.  Musculoskeletal: Normal gait and station of head and neck.   KPS = 100  100 - Normal; no complaints; no evidence of disease. 90   - Able to carry on normal activity; minor signs or symptoms of disease. 80   - Normal activity with effort; some signs or symptoms of disease. 5   - Cares for self; unable to carry on normal activity or to do active work. 60   - Requires occasional assistance, but is able to care for most of his personal needs. 50   - Requires considerable assistance and frequent medical care. 26   - Disabled; requires special care and assistance. 58   - Severely disabled; hospital admission is indicated although death not imminent. 72   - Very sick; hospital admission necessary; active supportive treatment necessary. 10   - Moribund; fatal processes progressing rapidly. 0     - Dead  Karnofsky DA, Abelmann [place], Craver LS and Burchenal [facility] [phone]) The use of the nitrogen mustards in the palliative treatment of carcinoma: with particular reference to bronchogenic carcinoma Cancer 1 [id number]   LABORATORY DATA:  Lab Results  Component Value Date   WBC 8.9 05/09/2014   HGB 14.5 05/09/2014   HCT 40.3 05/09/2014   MCV 95.5 05/09/2014   PLT 152 05/09/2014   Lab Results  Component Value Date   NA 146 (H) 09/03/2016   K 5.4 No visable hemolysis (H) 09/03/2016   CL 93 (L) 05/09/2014   CO2 21 (L) 09/03/2016   Lab Results  Component Value Date   ALT 52 09/03/2016   AST 57 (H) 09/03/2016   ALKPHOS 134 09/03/2016   BILITOT 0.90 09/03/2016     RADIOGRAPHY: No results found.    IMPRESSION: This gentleman is a 55 y.o. 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  His T-Stage, Gleason's Score, and PSA put him into the high risk group.  Accordingly he is eligible for a variety of potential treatment options including prostatectomy or IMRT with 2 years of ADT.  PLAN: Today I reviewed the findings  and workup thus far.  We discussed the natural history of prostate cancer.  We reviewed the the implications of T-stage, Gleason's Score, and PSA on decision-making and outcomes in prostate cancer.  We discussed radiation treatment in the management of prostate cancer and compared/contrasted it with prostatectomy. The patient is apprehensive coming to perform radiation 5 days a week for 8 weeks and ADT for 2 years. Therefore, he is leaning towards prostatectomy.  I will share my findings with Dr. Alinda Money. If the prostatectomy reveals that the  cancer is more locally advanced, then radiation to the pelvis would be necessary.  Due to the patient's PSA fluctuation, PSA will be rechecked today.  I enjoyed meeting with him today and will look forward to participating in the care of this very nice gentleman on an as needed basis.  I spent 60 minutes face to face with the patient and more than 50% of that time was spent in counseling and/or coordination of care.   ------------------------------------------------  Sheral Apley. Tammi Klippel, M.D.  This document serves as a record of services personally performed by Shona Simpson, PA-C and Tyler Pita, MD. It was created on their behalf by Darcus Austin, a trained medical scribe. The creation of this record is based on the scribe's personal observations and the providers' statements to them. This document has been checked and approved by the attending provider.

## 2016-09-03 NOTE — Progress Notes (Signed)
Received call from [place] to verify visit to the [facility] today and that patient had labs done. It was a three way call with patient giving permission to to verify the above.

## 2016-09-03 NOTE — Consult Note (Signed)
[facility]     09/03/2016   --------------------------------------------------------------------------------   Matthew Johnson  MRN: [id number]  PRIMARY CARE:    DOB: 1961-06-24, 55 year old Male  REFERRING:    SSN:-[id number]  PROVIDER:  Franchot Gallo, M.D.    TREATING:  Raynelle Bring, M.D.    LOCATION:  [facility] [phone]   --------------------------------------------------------------------------------   CC/HPI: CC: Prostate Cancer   Physician requesting consult: Dr. Franchot Gallo  PCP: Dr. Christella Noa  Location of consult: [facility] - [facility] Cancer [facility]   Matthew Johnson is a 55 year old gentleman who was noted to have an elevated screening PSA of 81.47. He was seen by Dr. Diona Fanti and was noted to have a normal DRE. His PSA was repeated and remained elevated at 51.7 prompting a TRUS biopsy of the prostate on 07/07/16 that demonstrated right sided hypoechoic areas and confirmed Gleason 3+4=7 adenocarcinoma of the prostate with 6 out of 12 biopsy cores positive for malignancy.   Family history: None   Imaging studies:  CT imaging of the abdomen and pelvis (07/21/16) - Cirrhosis and portal venous hypertension, right colon wall thickening, no lymphadenopathy or other evidence of metastatic prostate cancer, coronary artery disease and aortic atherosclerotic disease  Bone scan (07/21/16) - rib uptake consistent with recent rib fractures without any convincing evidence of metastatic disease   PMH: He has a history of GERD, hypertension, gout, hypercholesterolemia, and history of peptic ulcer, coronary and peripheral vascular disease by radiologic imaging (family history of CAD). He has no known history of cirrhosis.  PSH: Appendectomy.   TNM stage: cT1c N0 M0  PSA: 51.7  Gleason score: 3+4=7  Biopsy (07/07/16): 6/12 cores positive  Left: L apex (10%, 3+4=7)  Right: R apex (50%, 3+4=7), R lateral apex (50%,  3+3=6), R mid (70%, 3+4=7), R lateral mid (50%, 3+4=7), R lateral base (<5%, 3+4=7)  Prostate volume: 55.3 cc   Nomogram  OC disease: 4%  EPE: 95%  SVI: 28%  LNI: 26%  PFS (5 year, 10 year): 42%,28%   Urinary function: IPSS is 9.  Erectile function: SHIM score is 25.     ALLERGIES: None   MEDICATIONS: Aspirin 81 mg tablet, chewable  Aciphex 1 PO PRN  Bc  Diltiazem 24Hr Cd 360 mg capsule, ext release 24 hr  Fish Oil  Glucosamine  Indomethacin 25 mg capsule  Lovastatin 20 mg tablet  Multiple Vitamin  Potassium  Valsartan 160 mg tablet  Vitamin B12     GU PSH: Locm 300-399Mg /Ml Iodine,1Ml - 07/21/2016 Prostate Needle Biopsy - 07/07/2016      PSH Notes: Ankle; Leg; Hand   NON-GU PSH: Appendectomy Surgical Pathology, Gross And Microscopic Examination For Prostate Needle - 07/07/2016    GU PMH: BPH w/LUTS - 06/02/2016      PMH Notes: Esophageal Reflux; Gout; Gastric Ulcer   NON-GU PMH: Hypercholesterolemia Hypertension    FAMILY HISTORY: 1 Daughter - Runs in Family Death In The Family Father - Father Heart Attack - Father Hypertension - Mother   SOCIAL HISTORY: Marital Status: Married Current Smoking Status: Patient does not smoke anymore. Has not smoked since 05/28/1992.  Does drink.  Drinks 1 caffeinated drink per day.    REVIEW OF SYSTEMS:    GU Review Male:   Patient denies frequent urination, hard to postpone urination, burning/ pain with urination, get up at night to urinate, leakage of urine, stream starts and stops, trouble starting your streams, and have to  strain to urinate .  Gastrointestinal (Lower):   Patient denies diarrhea and constipation.  Gastrointestinal (Upper):   Patient denies nausea and vomiting.  Constitutional:   Patient denies fever, night sweats, weight loss, and fatigue.  Skin:   Patient denies itching and skin rash/ lesion.  Eyes:   Patient denies blurred vision and double vision.  Ears/ Nose/ Throat:   Patient denies sore throat  and sinus problems.  Hematologic/Lymphatic:   Patient denies swollen glands and easy bruising.  Cardiovascular:   Patient denies leg swelling and chest pains.  Respiratory:   Patient denies cough and shortness of breath.  Endocrine:   Patient denies excessive thirst.  Musculoskeletal:   Patient denies back pain and joint pain.  Neurological:   Patient denies headaches and dizziness.  Psychologic:   Patient denies depression and anxiety.   VITAL SIGNS: None   GU PHYSICAL EXAMINATION:    Prostate: 50 cc without nodularity or induration noted.   MULTI-SYSTEM PHYSICAL EXAMINATION:    Constitutional: Well-nourished. No physical deformities. Normally developed. Good grooming.  Neck: Neck symmetrical, not swollen. Normal tracheal position.  Respiratory: No labored breathing, no use of accessory muscles. Clear bilaterally.  Cardiovascular: Normal temperature, normal extremity pulses, no swelling, no varicosities. Regular rate and rhythm.  Lymphatic: No enlargement of neck, axillae, groin.  Skin: No paleness, no jaundice, no cyanosis. No lesion, no ulcer, no rash.  Neurologic / Psychiatric: Oriented to time, oriented to place, oriented to person. No depression, no anxiety, no agitation.  Gastrointestinal: No mass, no tenderness, no rigidity, non obese abdomen. Right lower quadrant scar well-healed.  Eyes: Normal conjunctivae. Normal eyelids.  Ears, Nose, Mouth, and Throat: Left ear no scars, no lesions, no masses. Right ear no scars, no lesions, no masses. Nose no scars, no lesions, no masses. Normal hearing. Normal lips.  Musculoskeletal: Normal gait and station of head and neck.     PAST DATA REVIEWED:  Source Of History:  Patient  Lab Test Review:   PSA  Records Review:   Pathology Reports, Previous Patient Records  X-Ray Review: C.T. Abdomen/Pelvis: Reviewed Films.  Bone Scan: Reviewed Films.     06/02/16 05/25/16  PSA  Total PSA 51.70  81.47 ng/dl    PROCEDURES: None    ASSESSMENT:      ICD-10 Details  1 GU:   Prostate Cancer - C61    PLAN:           Document Letter(s):  Created for Patient: Clinical Summary         Notes:   1. Prostate cancer: I had a long and detailed discussion with Matthew Johnson and his wife today regarding his prostate cancer diagnosis and options for treatment. I did recommend therapy of curative intent based on his disease parameters and his life expectancy.   The patient was counseled about the natural history of prostate cancer and the standard treatment options that are available for prostate cancer. It was explained to him how his age and life expectancy, clinical stage, Gleason score, and PSA affect his prognosis, the decision to proceed with additional staging studies, as well as how that information influences recommended treatment strategies. We discussed the roles for active surveillance, radiation therapy, surgical therapy, androgen deprivation, as well as ablative therapy options for the treatment of prostate cancer as appropriate to his individual cancer situation. We discussed the risks and benefits of these options with regard to their impact on cancer control and also in terms of potential adverse events, complications,  and impact on quality of life particularly related to urinary and sexual function. The patient was encouraged to ask questions throughout the discussion today and all questions were answered to his stated satisfaction. In addition, the patient was provided with and/or directed to appropriate resources and literature for further education about prostate cancer and treatment options.   We discussed surgical therapy for prostate cancer including the different available surgical approaches. We discussed, in detail, the risks and expectations of surgery with regard to cancer control, urinary control, and erectile function as well as the expected postoperative recovery process. Additional risks of surgery including but  not limited to bleeding, infection, hernia formation, nerve damage, lymphocele formation, bowel/rectal injury potentially necessitating colostomy, damage to the urinary tract resulting in urine leakage, urethral stricture, and the cardiopulmonary risks such as myocardial infarction, stroke, death, venothromboembolism, etc. were explained. The risk of open surgical conversion for robotic/laparoscopic prostatectomy was also discussed.   After our discussion today, he is most interested in proceeding with surgical therapy although would like to discuss his options with his wife and his other family members before making a final decision. He has numerous questions today and feels well-informed.   He will have a repeat PSA today considering his very high PSA initially and the subsequent significant drop in his PSA that may indicate an inflammatory component of his PSA. This will better help to assess his overall risk, although he understands this will not likely change his treatment recommendations. He also will have liver function tests drawn today considering cirrhotic changes noted on his liver on his recent CT scan.   If he does proceed with surgical therapy, I have recommended that he proceed with a preoperative cardiac risk assessment considering the significant coronary artery calcification seen on his CT scan, his strong family history of coronary artery disease, and his prior chest pain symptoms which she had even as recently as July. If his liver function tests are normal, we likely can proceed with surgical therapy although I will make Dr. Doyle Askew aware of his recent CT findings suggesting cirrhosis. He states that he does drink alcohol although will sometimes go many months in between drinking but sometimes will drink more excessively than others. He denies any other risk factors for development of cirrhosis. He has no clinical symptoms of cirrhosis systemically.   My surgical plan would be to proceed  with a bilateral nerve sparing robot-assisted laparoscopic radical prostatectomy and bilateral pelvic lymphadenectomy. He appears to be interested in proceeding sometime in late January or early February after his birthday.   Cc: Franchot Gallo  Dr. Christella Noa  Dr. Tyler Pita  Dr. Zola Button           E & M CODE: I spent at least 80 minutes face to face with the patient, more than 50% of that time was spent on counseling and/or coordinating care.

## 2016-09-04 LAB — PSA: Prostate Specific Ag, Serum: 65.8 ng/mL — ABNORMAL HIGH (ref 0.0–4.0)

## 2016-09-07 ENCOUNTER — Encounter: Payer: Self-pay | Admitting: General Practice

## 2016-09-07 NOTE — Progress Notes (Signed)
Matthew Johnson was seen in [facility] on 09/03/16 and was provided with a full packet of print materials  to introduce [facility] team/resources.  The patient scored a 5 on the Psychosocial Distress Thermometer which indicates moderate distress. Attempted to reach pt by phone to review distress screen per protocol and to assess for distress and other psychosocial needs; left VM of introduction on wife's cell [phone]), as pt directed on his distress screen.  Encouraged pt to return call.  ONCBCN DISTRESS SCREENING 09/07/2016  Screening Type Initial Screening  Distress experienced in past week (1-10) 5  Practical problem type Insurance  Emotional problem type Boredom  Spiritual/Religous concerns type Relating to God  Information Concerns Type Lack of info about diagnosis;Lack of info about treatment;Lack of info about complementary therapy choices  Physical Problem type Constipation/diarrhea  Other (No Data)    Follow up needed: Yes.   Plan to f/u by phone if pt does not return call.  Please also page if immediate needs arise.  Thank you.  [place], [place], [facility] Pager [phone] Voicemail [phone]

## 2016-09-13 ENCOUNTER — Encounter: Payer: Self-pay | Admitting: General Practice

## 2016-09-13 NOTE — Progress Notes (Signed)
[facility] Note  Left f/u VM offering info about Support Center team/resource availability.   [place], [place], [facility] Pager [phone] Voicemail [phone]

## 2016-09-30 ENCOUNTER — Other Ambulatory Visit: Payer: Self-pay | Admitting: Urology

## 2016-10-04 ENCOUNTER — Encounter (INDEPENDENT_AMBULATORY_CARE_PROVIDER_SITE_OTHER): Payer: Self-pay

## 2016-10-04 ENCOUNTER — Telehealth: Payer: Self-pay | Admitting: Cardiology

## 2016-10-04 ENCOUNTER — Ambulatory Visit (INDEPENDENT_AMBULATORY_CARE_PROVIDER_SITE_OTHER): Payer: BLUE CROSS/BLUE SHIELD | Admitting: Cardiology

## 2016-10-04 ENCOUNTER — Encounter: Payer: Self-pay | Admitting: Cardiology

## 2016-10-04 VITALS — BP 145/78 | HR 56 | Ht 76.0 in | Wt 253.1 lb

## 2016-10-04 DIAGNOSIS — I3139 Other pericardial effusion (noninflammatory): Secondary | ICD-10-CM

## 2016-10-04 DIAGNOSIS — I2510 Atherosclerotic heart disease of native coronary artery without angina pectoris: Secondary | ICD-10-CM | POA: Diagnosis not present

## 2016-10-04 DIAGNOSIS — C61 Malignant neoplasm of prostate: Secondary | ICD-10-CM | POA: Diagnosis not present

## 2016-10-04 DIAGNOSIS — I10 Essential (primary) hypertension: Secondary | ICD-10-CM | POA: Diagnosis not present

## 2016-10-04 DIAGNOSIS — E785 Hyperlipidemia, unspecified: Secondary | ICD-10-CM

## 2016-10-04 DIAGNOSIS — Z01818 Encounter for other preprocedural examination: Secondary | ICD-10-CM

## 2016-10-04 DIAGNOSIS — I313 Pericardial effusion (noninflammatory): Secondary | ICD-10-CM

## 2016-10-04 NOTE — Patient Instructions (Signed)
Medication Instructions:  Your physician recommends that you continue on your current medications as directed. Please refer to the Current Medication list given to you today.   Labwork: None  Testing/Procedures: Your physician has requested that you have an echocardiogram. Echocardiography is a painless test that uses sound waves to create images of your heart. It provides your doctor with information about the size and shape of your heart and how well your heart's chambers and valves are working. This procedure takes approximately one hour. There are no restrictions for this procedure.   Your physician has requested that you have a lexiscan myoview. For further information please visit [email or web address]. Please follow instruction sheet, as given.   Dr. Radford Pax recommends you have a CALCIUM SCORE.  Follow-Up: Your physician wants you to follow-up in: 1 year with Dr. Radford Pax. You will receive a reminder letter in the mail two months in advance. If you don't receive a letter, please call our office to schedule the follow-up appointment.   Any Other Special Instructions Will Be Listed Below (If Applicable).     If you need a refill on your cardiac medications before your next appointment, please call your pharmacy.

## 2016-10-04 NOTE — Telephone Encounter (Signed)
New message      Pt was seen today.  He has a cancer policy and need [place] to call [employer] to say he had an ov today.  Please call [phone], opt 5 then opt 1.  If any problems, please call pt

## 2016-10-04 NOTE — Progress Notes (Signed)
Cardiology Office Note    Date:  10/04/2016   ID:  Matthew Johnson, DOB 1961/01/24, MRN [id number]  PCP:  Abigail Miyamoto, MD  Cardiologist:  Fransico Him, MD   Chief Complaint  Patient presents with  . New Evaluation    pre operative clearance for prostate CA and coronary artery calcifications noted on CT    History of Present Illness:  Matthew Johnson is a 56 y.o. male  with a history of HTN, hyperlipidemia, GERD and gastric ulcer who was recently diagnosed with prostate CA who is referred for presurgical cardiac clearance prior to undergoing bilateral nerve sparing robot-assisted laparoscopic radiacl prostatectomy and bilateral pelvic lymphadenectomy.  He was noted on CT of the abdomen to have mild cardiomegaly with trivial pericardial effusion and multivessel coronary artery atherosclerosis.  He has no history of cardiac disease.  He has a remote history of tobacco use but quit 25 years ago.  He denies any chest pain or pressure, SOB, DOE, LE edema, PND, orthopnea, dizziness, palpitations or syncope.      Past Medical History:  Diagnosis Date  . Acid reflux   . Coronary artery calcification seen on CAT scan    multivessel  . Gout   . High cholesterol   . Hypertension   . Malignant neoplasm of prostate ([place]) 09/03/2016  . Prostate cancer [facility])     Past Surgical History:  Procedure Laterality Date  . APPENDECTOMY  age 8  . ORIF ANKLE FRACTURE    . PERCUTANEOUS PINNING Left 05/09/2014   Procedure: Closed Reduction Percutaneous Pinning Left Small Finger;  Surgeon: Leanora Cover, MD;  Location: [place];  Service: Orthopedics;  Laterality: Left;  . PROSTATE BIOPSY    . SKIN GRAFT  1988   right left    Current Medications: Outpatient Medications Prior to Visit  Medication Sig Dispense Refill  . aspirin EC 81 MG tablet Take 81 mg by mouth daily.    . Aspirin-Salicylamide-Caffeine (BC HEADACHE POWDER PO) Take by mouth.    . Carboxymethylcellul-Glycerin (CLEAR EYES FOR DRY EYES OP)  Place 3 drops into both eyes daily.    Marland Kitchen diltiazem (TIAZAC) 240 MG 24 hr capsule Take 240 mg by mouth daily.    Marland Kitchen glucosamine-chondroitin 500-400 MG tablet Take 1 tablet by mouth 3 (three) times daily.    . indomethacin (INDOCIN SR) 75 MG CR capsule Take 75 mg by mouth 2 (two) times daily as needed for moderate pain.     Marland Kitchen lovastatin (MEVACOR) 20 MG tablet Take 20 mg by mouth at bedtime.    . Multiple Vitamins-Minerals (MULTIVITAMIN PO) Take 1 tablet by mouth every morning.    . Omega-3 Fatty Acids (FISH OIL) 1200 MG CAPS Take 4,800 mg by mouth every morning.    Marland Kitchen oxyCODONE-acetaminophen (PERCOCET) 5-325 MG per tablet 1-2 tabs po q6 hours prn pain 40 tablet 0  . RABEprazole (ACIPHEX) 20 MG tablet Take 20 mg by mouth daily as needed (for heart burn).     . vitamin B-12 (CYANOCOBALAMIN) 1000 MCG tablet Take 1,000 mcg by mouth daily.    . potassium chloride SA (K-DUR,KLOR-CON) 20 MEQ tablet Take 20 mEq by mouth 2 (two) times daily.    Marland Kitchen sulfamethoxazole-trimethoprim (BACTRIM DS) 800-160 MG per tablet Take 1 tablet by mouth 2 (two) times daily. (Patient not taking: Reported on 10/04/2016) 14 tablet 0  . valsartan-hydrochlorothiazide (DIOVAN-HCT) 160-25 MG per tablet Take 1 tablet by mouth daily.     No facility-administered medications prior to visit.  Allergies:   Patient has no known allergies.   Social History   Social History  . Marital status: Married    Spouse name: N/A  . Number of children: N/A  . Years of education: N/A   Social History Main Topics  . Smoking status: Former Smoker    Types: Cigarettes    Quit date: 05/28/1992  . Smokeless tobacco: Never Used  . Alcohol use Yes     Comment: reports sometimes a whole lot  . Drug use: No  . Sexual activity: Not Asked   Other Topics Concern  . None   Social History Narrative  . None     Family History:  The patient's family history includes Heart attack in his father; Hypertension in his mother.   ROS:   Please see the  history of present illness.    ROS All other systems reviewed and are negative.  No flowsheet data found.     PHYSICAL EXAM:   VS:  BP (!) 145/78   Pulse (!) 56   Ht 6\' 4"  (1.93 m)   Wt 253 lb 1.9 oz (114.8 kg)   BMI 30.81 kg/m    GEN: Well nourished, well developed, in no acute distress  HEENT: normal  Neck: no JVD, carotid bruits, or masses Cardiac: RRR; no murmurs, rubs, or gallops,no edema.  Intact distal pulses bilaterally.  Respiratory:  clear to auscultation bilaterally, normal work of breathing GI: soft, nontender, nondistended, + BS MS: no deformity or atrophy  Skin: warm and dry, no rash Neuro:  Alert and Oriented x 3, Strength and sensation are intact Psych: euthymic mood, full affect  Wt Readings from Last 3 Encounters:  10/04/16 253 lb 1.9 oz (114.8 kg)  09/03/16 253 lb 12.8 oz (115.1 kg)  05/21/14 250 lb (113.4 kg)      Studies/Labs Reviewed:   EKG:  EKG is ordered today.  The ekg ordered today demonstrates sinus bradycardia 56bpm  Recent Labs: 09/03/2016: ALT 52; BUN 22.6; Creatinine 1.8; Potassium 5.4 No visable hemolysis; Sodium 146   Lipid Panel No results found for: CHOL, TRIG, HDL, CHOLHDL, VLDL, LDLCALC, LDLDIRECT  Additional studies/ records that were reviewed today include:  Office notes from Urology    ASSESSMENT:    1. Preoperative clearance   2. Coronary artery calcification seen on CAT scan   3. Malignant neoplasm of prostate ([place])   4. Benign essential HTN   5. Hyperlipidemia LDL goal <70      PLAN:  In order of problems listed above:  1. Preoperative cardiac clearance - he has no cardiac symptoms but had coronary artery calcifications noted on recent CT of the abdomen.  His CFRs include HTN, hyperlipidemia, prior tobacco use and family history of CAD.  I will get a Lexiscan myoview prior to surgery to rule out ischemia.  He cannot walk due to steel plates in his feet.   2. Coronary artery calcifications on CT scan with multiple  CRFs (see above).  Will get [employer] and also get a chest CT for calcium score to determine risk.  If score high then needs aggressive risk factor modification.   3. Prostate CA - radical prostatectomy scheduled. 4.   HTN - BP slightly elevated today but at home runs normal. He brought in his readings of today.  5.   Hyperlipidemia - LDL goal < 70.     Medication Adjustments/Labs and Tests Ordered: Current medicines are reviewed at length with the patient today.  Concerns regarding  medicines are outlined above.  Medication changes, Labs and Tests ordered today are listed in the Patient Instructions below.  There are no Patient Instructions on file for this visit.   Signed, Fransico Him, MD  10/04/2016 2:18 PM    [facility] [address], [place], [place]  [address] Phone: [phone]; Fax: [phone]

## 2016-10-05 ENCOUNTER — Telehealth (HOSPITAL_COMMUNITY): Payer: Self-pay | Admitting: *Deleted

## 2016-10-05 NOTE — Telephone Encounter (Signed)
Confirmed with patient his appointment was confirmed with [facility]. He declines MyChart - he states he never uses the computer. He was grateful for follow-up.

## 2016-10-05 NOTE — Telephone Encounter (Signed)
Patient given detailed instructions per Myocardial Perfusion Study Information Sheet for the test on 10/08/16 at [place]. Patient notified to arrive 15 minutes early and that it is imperative to arrive on time for appointment to keep from having the test rescheduled.  If you need to cancel or reschedule your appointment, please call the office within 24 hours of your appointment. Failure to do so may result in a cancellation of your appointment, and a $50 no show fee. Patient verbalized understanding.Zachry Hopfensperger, Ranae Palms

## 2016-10-05 NOTE — Telephone Encounter (Signed)
Called [employer] per patient request. Per Medical Records, informed [employer] representative that the only information that may be shared is confirmation that the patient was here. She requests to know if labs were drawn. Reiterated to her that only confirmation the patient was here could be given.  Informed her the patient was given MyChart sign-up instructions. She reports he can give them those results as proof.  Called patient to follow-up and instruct him to sign up for MyChart. He states he is busy and to call him later.

## 2016-10-07 ENCOUNTER — Telehealth: Payer: Self-pay | Admitting: Medical Oncology

## 2016-10-07 NOTE — Progress Notes (Signed)
Matthew Johnson states he is seeing Dr. Radford Pax for cardiac clearance. He is scheduled for cardiac tests tomorrow. He had questions regarding post prostatectomy and I answered these. He hopes he will get his surgery scheduled soon. I will continue to follow.

## 2016-10-08 ENCOUNTER — Ambulatory Visit (INDEPENDENT_AMBULATORY_CARE_PROVIDER_SITE_OTHER)
Admission: RE | Admit: 2016-10-08 | Discharge: 2016-10-08 | Disposition: A | Discharge status: Home / Self Care | Payer: Self-pay | Source: Ambulatory Visit | Attending: Cardiology | Admitting: Cardiology

## 2016-10-08 ENCOUNTER — Other Ambulatory Visit: Payer: Self-pay

## 2016-10-08 ENCOUNTER — Ambulatory Visit (HOSPITAL_BASED_OUTPATIENT_CLINIC_OR_DEPARTMENT_OTHER): Payer: BLUE CROSS/BLUE SHIELD

## 2016-10-08 ENCOUNTER — Ambulatory Visit (HOSPITAL_COMMUNITY): Payer: BLUE CROSS/BLUE SHIELD | Attending: Cardiology

## 2016-10-08 DIAGNOSIS — I371 Nonrheumatic pulmonary valve insufficiency: Secondary | ICD-10-CM | POA: Insufficient documentation

## 2016-10-08 DIAGNOSIS — I340 Nonrheumatic mitral (valve) insufficiency: Secondary | ICD-10-CM | POA: Diagnosis not present

## 2016-10-08 DIAGNOSIS — Z87891 Personal history of nicotine dependence: Secondary | ICD-10-CM | POA: Insufficient documentation

## 2016-10-08 DIAGNOSIS — I313 Pericardial effusion (noninflammatory): Secondary | ICD-10-CM

## 2016-10-08 DIAGNOSIS — Z8249 Family history of ischemic heart disease and other diseases of the circulatory system: Secondary | ICD-10-CM | POA: Diagnosis not present

## 2016-10-08 DIAGNOSIS — I3139 Other pericardial effusion (noninflammatory): Secondary | ICD-10-CM

## 2016-10-08 DIAGNOSIS — E119 Type 2 diabetes mellitus without complications: Secondary | ICD-10-CM | POA: Insufficient documentation

## 2016-10-08 DIAGNOSIS — Z01818 Encounter for other preprocedural examination: Secondary | ICD-10-CM | POA: Diagnosis not present

## 2016-10-08 DIAGNOSIS — I2510 Atherosclerotic heart disease of native coronary artery without angina pectoris: Secondary | ICD-10-CM

## 2016-10-08 DIAGNOSIS — I071 Rheumatic tricuspid insufficiency: Secondary | ICD-10-CM | POA: Diagnosis not present

## 2016-10-08 DIAGNOSIS — I10 Essential (primary) hypertension: Secondary | ICD-10-CM | POA: Diagnosis not present

## 2016-10-08 LAB — MYOCARDIAL PERFUSION IMAGING
CSEPPHR: 77 {beats}/min
LHR: 0.39
LV sys vol: 64 mL
LVDIAVOL: 151 mL (ref 62–150)
Rest HR: 58 {beats}/min
SDS: 2
SRS: 3
SSS: 5
TID: 0.92

## 2016-10-08 MED ORDER — TECHNETIUM TC 99M TETROFOSMIN IV KIT
31.7000 | PACK | Freq: Once | INTRAVENOUS | Status: AC | PRN
  Administered 2016-10-08: 31.7 via INTRAVENOUS
  Filled 2016-10-08: qty 32

## 2016-10-08 MED ORDER — TECHNETIUM TC 99M TETROFOSMIN IV KIT
10.5000 | PACK | Freq: Once | INTRAVENOUS | Status: AC | PRN
  Administered 2016-10-08: 10.5 via INTRAVENOUS
  Filled 2016-10-08: qty 11

## 2016-10-08 MED ORDER — REGADENOSON 0.4 MG/5ML IV SOLN
0.4000 mg | Freq: Once | INTRAVENOUS | Status: AC
  Administered 2016-10-08: 0.4 mg via INTRAVENOUS

## 2016-10-11 ENCOUNTER — Encounter: Payer: Self-pay | Admitting: Cardiology

## 2016-10-11 ENCOUNTER — Telehealth: Payer: Self-pay | Admitting: Cardiology

## 2016-10-11 NOTE — Telephone Encounter (Signed)
Returned call to [facility] with [facility].Left message on her voice mail I will send message to Dr.Turner for cardiac clearance for her upcoming surgery.

## 2016-10-11 NOTE — Telephone Encounter (Signed)
Cardiac clearance provided under my interpretation for stress test sent today to Lenice Llamas, RN

## 2016-10-11 NOTE — Telephone Encounter (Signed)
[facility]-Calling for status of surgical clearance-once results in and read for stress/Echo and CT cardiac scoring are in -pls fax clearance to [phone]. Alinda Money.

## 2016-10-12 ENCOUNTER — Telehealth: Payer: Self-pay | Admitting: Cardiology

## 2016-10-12 ENCOUNTER — Telehealth: Payer: Self-pay | Admitting: Medical Oncology

## 2016-10-12 ENCOUNTER — Other Ambulatory Visit: Payer: Self-pay | Admitting: Urology

## 2016-10-12 ENCOUNTER — Telehealth: Payer: Self-pay

## 2016-10-12 DIAGNOSIS — I714 Abdominal aortic aneurysm, without rupture, unspecified: Secondary | ICD-10-CM

## 2016-10-12 DIAGNOSIS — E785 Hyperlipidemia, unspecified: Secondary | ICD-10-CM

## 2016-10-12 DIAGNOSIS — I2510 Atherosclerotic heart disease of native coronary artery without angina pectoris: Secondary | ICD-10-CM

## 2016-10-12 NOTE — Telephone Encounter (Signed)
New Message:    Matthew Johnson said they received the test results,but needs to verify that pt is cleared for surgery please.

## 2016-10-12 NOTE — Telephone Encounter (Signed)
Left message for Matthew Johnson that clearance is located on stress test results. Instructed her to call if she has further questions.

## 2016-10-12 NOTE — Telephone Encounter (Signed)
Informed patient of results and verbal understanding expressed.  NMR and ALT scheduled 1/31. He understands to call if he experiences any chest pain or DOE. Recall placed to follow up with Dr. Radford Pax in 1 year. CT ordered to be scheduled in 1 year. Copy will be sent to his PCP to follow-up on nodule per patient request. Copies of testing mailed to patient per request. He was grateful for call.

## 2016-10-12 NOTE — Telephone Encounter (Signed)
Matthew Johnson called asking if Dr. Alinda Money has gotten the results of his cardiac studies and if his surgery is scheduled. I explained to him that I gave Dr. Alinda Money the date he requested and when he gets clearance from Dr. Radford Pax, Dr. Lynne Logan office will contact him. He voiced understanding.

## 2016-10-12 NOTE — Telephone Encounter (Signed)
-----   Message from Sueanne Margarita, MD sent at 10/11/2016 10:18 AM EST ----- Patient has very high calcium score of 3204.  Nuclear stress test showed no ischemia.  He needs aggressive risk factor modification.  Please get a fasting lipid panel with NMR profile and ALT.  He needs to notify me if he develops any chest pain or DOE.  Please have him followup with me in 1 year.  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  Echo did not demonstrate this so repeat chest CT angio in 1 year to followup and if stable will get CT every 2 years thereafter.  He also had a subpleural nodule felt to be a lymph node which is stable and felt to be benign.

## 2016-10-12 NOTE — Telephone Encounter (Signed)
Stress test results forwarded to Dr. Alinda Money fax: [phone]

## 2016-10-25 NOTE — Patient Instructions (Signed)
Matthew Johnson  10/25/2016   Your procedure is scheduled on: Thursday 10/28/2016  Report to [facility]  Entrance take [facility]  elevators to 3rd floor to  [place] at  0930  AM.  Call this number if you have problems the morning of surgery [phone]   Remember: ONLY 1 PERSON MAY GO WITH YOU TO SHORT STAY TO GET  READY MORNING OF [place].               FOLLOW BOWEL PREP INSTRUCTIONS FROM DR. BORDEN'S OFFICE ON Wednesday 10/27/2016 ALONG WITH A CLEAR LIQUID DIET ALL DAY UP UNTIL MIDNIGHT!             USE FLEET'S ENEMA NIGHT BEFORE SURGERY AS DIRECTED!                CLEAR LIQUID DIET   Foods Allowed                                                                     Foods Excluded  Coffee and tea, regular and decaf                             liquids that you cannot  Plain Jell-O in any flavor                                             see through such as: Fruit ices (not with fruit pulp)                                     milk, soups, orange juice  Iced Popsicles                                    All solid food Carbonated beverages, regular and diet                                    Cranberry, grape and apple juices Sports drinks like Gatorade Lightly seasoned clear broth or consume(fat free) Sugar, honey syrup  Sample Menu Breakfast                                Lunch                                     Supper Cranberry juice                    Beef broth                            Chicken broth Jell-O  Grape juice                           Apple juice Coffee or tea                        Jell-O                                      Popsicle                                                Coffee or tea                        Coffee or tea  _____________________________________________________________________     Do not eat food or drink liquids :After Midnight.     Take these medicines the  morning of surgery with A SIP OF WATER: DILTIAZEM  (CARDIZEM CD), USE CLEAR EYES DROPS IF NEEDED, RABEPRAZOLE (ACIPHEX) IF NEEDED                                You may not have any metal on your body including hair pins and              piercings  Do not wear jewelry, make-up, lotions, powders or perfumes, deodorant             Do not wear nail polish.  Do not shave  48 hours prior to surgery.              Men may shave face and neck.   Do not bring valuables to the hospital. [place].  Contacts, dentures or bridgework may not be worn into surgery.  Leave suitcase in the car. After surgery it may be brought to your room.                  Please read over the following fact sheets you were given: _____________________________________________________________________             [facility] - Preparing for Surgery Before surgery, you can play an important role.  Because skin is not sterile, your skin needs to be as free of germs as possible.  You can reduce the number of germs on your skin by washing with CHG (chlorahexidine gluconate) soap before surgery.  CHG is an antiseptic cleaner which kills germs and bonds with the skin to continue killing germs even after washing. Please DO NOT use if you have an allergy to CHG or antibacterial soaps.  If your skin becomes reddened/irritated stop using the CHG and inform your nurse when you arrive at Short Stay. Do not shave (including legs and underarms) for at least 48 hours prior to the first CHG shower.  You may shave your face/neck. Please follow these instructions carefully:  1.  Shower with CHG Soap the night before surgery and the  morning of Surgery.  2.  If you choose to wash your hair, wash your hair first as usual with your  normal  shampoo.  3.  After you shampoo, rinse your hair and body thoroughly to remove the  shampoo.                           4.  Use CHG as you would any other  liquid soap.  You can apply chg directly  to the skin and wash                       Gently with a scrungie or clean washcloth.  5.  Apply the CHG Soap to your body ONLY FROM THE NECK DOWN.   Do not use on face/ open                           Wound or open sores. Avoid contact with eyes, ears mouth and genitals (private parts).                       Wash face,  Genitals (private parts) with your normal soap.             6.  Wash thoroughly, paying special attention to the area where your surgery  will be performed.  7.  Thoroughly rinse your body with warm water from the neck down.  8.  DO NOT shower/wash with your normal soap after using and rinsing off  the CHG Soap.                9.  Pat yourself dry with a clean towel.            10.  Wear clean pajamas.            11.  Place clean sheets on your bed the night of your first shower and do not  sleep with pets. Day of Surgery : Do not apply any lotions/deodorants the morning of surgery.  Please wear clean clothes to the hospital/surgery center.  FAILURE TO FOLLOW THESE INSTRUCTIONS MAY RESULT IN THE CANCELLATION OF YOUR SURGERY PATIENT SIGNATURE_________________________________  NURSE SIGNATURE__________________________________  ________________________________________________________________________   Adam Phenix  An incentive spirometer is a tool that can help keep your lungs clear and active. This tool measures how well you are filling your lungs with each breath. Taking long deep breaths may help reverse or decrease the chance of developing breathing (pulmonary) problems (especially infection) following:  A long period of time when you are unable to move or be active. BEFORE THE PROCEDURE   If the spirometer includes an indicator to show your best effort, your nurse or respiratory therapist will set it to a desired goal.  If possible, sit up straight or lean slightly forward. Try not to slouch.  Hold the incentive  spirometer in an upright position. INSTRUCTIONS FOR USE  1. Sit on the edge of your bed if possible, or sit up as far as you can in bed or on a chair. 2. Hold the incentive spirometer in an upright position. 3. Breathe out normally. 4. Place the mouthpiece in your mouth and seal your lips tightly around it. 5. Breathe in slowly and as deeply as possible, raising the piston or the ball toward the top of the column. 6. Hold your breath for 3-5 seconds or for as long as possible. Allow the piston or ball to fall to the bottom of the column. 7. Remove the mouthpiece from your mouth and breathe out  normally. 8. Rest for a few seconds and repeat Steps 1 through 7 at least 10 times every 1-2 hours when you are awake. Take your time and take a few normal breaths between deep breaths. 9. The spirometer may include an indicator to show your best effort. Use the indicator as a goal to work toward during each repetition. 10. After each set of 10 deep breaths, practice coughing to be sure your lungs are clear. If you have an incision (the cut made at the time of surgery), support your incision when coughing by placing a pillow or rolled up towels firmly against it. Once you are able to get out of bed, walk around indoors and cough well. You may stop using the incentive spirometer when instructed by your caregiver.  RISKS AND COMPLICATIONS  Take your time so you do not get dizzy or light-headed.  If you are in pain, you may need to take or ask for pain medication before doing incentive spirometry. It is harder to take a deep breath if you are having pain. AFTER USE  Rest and breathe slowly and easily.  It can be helpful to keep track of a log of your progress. Your caregiver can provide you with a simple table to help with this. If you are using the spirometer at home, follow these instructions: [place] IF:   You are having difficultly using the spirometer.  You have trouble using the  spirometer as often as instructed.  Your pain medication is not giving enough relief while using the spirometer.  You develop fever of 100.5 F (38.1 C) or higher. SEEK IMMEDIATE MEDICAL CARE IF:   You cough up bloody sputum that had not been present before.  You develop fever of 102 F (38.9 C) or greater.  You develop worsening pain at or near the incision site. MAKE SURE YOU:   Understand these instructions.  Will watch your condition.  Will get help right away if you are not doing well or get worse. Document Released: 01/24/2007 Document Revised: 12/06/2011 Document Reviewed: 03/27/2007 Exit[employer] Patient Information 2014 Exit[employer], [place].   ________________________________________________________________________  WHAT IS A BLOOD TRANSFUSION? Blood Transfusion Information  A transfusion is the replacement of blood or some of its parts. Blood is made up of multiple cells which provide different functions.  Red blood cells carry oxygen and are used for blood loss replacement.  White blood cells fight against infection.  Platelets control bleeding.  Plasma helps clot blood.  Other blood products are available for specialized needs, such as hemophilia or other clotting disorders. BEFORE THE TRANSFUSION  Who gives blood for transfusions?   Healthy volunteers who are fully evaluated to make sure their blood is safe. This is blood bank blood. Transfusion therapy is the safest it has ever been in the practice of medicine. Before blood is taken from a donor, a complete history is taken to make sure that person has no history of diseases nor engages in risky social behavior (examples are intravenous drug use or sexual activity with multiple partners). The donor's travel history is screened to minimize risk of transmitting infections, such as malaria. The donated blood is tested for signs of infectious diseases, such as HIV and hepatitis. The blood is then tested to be sure it is  compatible with you in order to minimize the chance of a transfusion reaction. If you or a relative donates blood, this is often done in anticipation of surgery and is not appropriate for emergency situations.  It takes many days to process the donated blood. RISKS AND COMPLICATIONS Although transfusion therapy is very safe and saves many lives, the main dangers of transfusion include:   Getting an infectious disease.  Developing a transfusion reaction. This is an allergic reaction to something in the blood you were given. Every precaution is taken to prevent this. The decision to have a blood transfusion has been considered carefully by your caregiver before blood is given. Blood is not given unless the benefits outweigh the risks. AFTER THE TRANSFUSION  Right after receiving a blood transfusion, you will usually feel much better and more energetic. This is especially true if your red blood cells have gotten low (anemic). The transfusion raises the level of the red blood cells which carry oxygen, and this usually causes an energy increase.  The nurse administering the transfusion will monitor you carefully for complications. HOME CARE INSTRUCTIONS  No special instructions are needed after a transfusion. You may find your energy is better. Speak with your caregiver about any limitations on activity for underlying diseases you may have. SEEK MEDICAL CARE IF:   Your condition is not improving after your transfusion.  You develop redness or irritation at the intravenous (IV) site. SEEK IMMEDIATE MEDICAL CARE IF:  Any of the following symptoms occur over the next 12 hours:  Shaking chills.  You have a temperature by mouth above 102 F (38.9 C), not controlled by medicine.  Chest, back, or muscle pain.  People around you feel you are not acting correctly or are confused.  Shortness of breath or difficulty breathing.  Dizziness and fainting.  You get a rash or develop hives.  You have  a decrease in urine output.  Your urine turns a dark color or changes to pink, red, or brown. Any of the following symptoms occur over the next 10 days:  You have a temperature by mouth above 102 F (38.9 C), not controlled by medicine.  Shortness of breath.  Weakness after normal activity.  The white part of the eye turns yellow (jaundice).  You have a decrease in the amount of urine or are urinating less often.  Your urine turns a dark color or changes to pink, red, or brown. Document Released: 09/10/2000 Document Revised: 12/06/2011 Document Reviewed: 04/29/2008 [facility] Patient Information 2014 [place], [place].  _______________________________________________________________________

## 2016-10-26 ENCOUNTER — Encounter (HOSPITAL_COMMUNITY): Payer: Self-pay

## 2016-10-26 ENCOUNTER — Encounter (HOSPITAL_COMMUNITY)
Admission: RE | Admit: 2016-10-26 | Discharge: 2016-10-26 | Disposition: A | Discharge status: Home / Self Care | Payer: BLUE CROSS/BLUE SHIELD | Source: Ambulatory Visit | Attending: Urology | Admitting: Urology

## 2016-10-26 DIAGNOSIS — I2510 Atherosclerotic heart disease of native coronary artery without angina pectoris: Secondary | ICD-10-CM | POA: Diagnosis not present

## 2016-10-26 DIAGNOSIS — Z7982 Long term (current) use of aspirin: Secondary | ICD-10-CM | POA: Diagnosis not present

## 2016-10-26 DIAGNOSIS — Z01812 Encounter for preprocedural laboratory examination: Secondary | ICD-10-CM | POA: Insufficient documentation

## 2016-10-26 DIAGNOSIS — E7800 Pure hypercholesterolemia, unspecified: Secondary | ICD-10-CM | POA: Insufficient documentation

## 2016-10-26 DIAGNOSIS — C61 Malignant neoplasm of prostate: Secondary | ICD-10-CM

## 2016-10-26 DIAGNOSIS — K219 Gastro-esophageal reflux disease without esophagitis: Secondary | ICD-10-CM | POA: Insufficient documentation

## 2016-10-26 DIAGNOSIS — I10 Essential (primary) hypertension: Secondary | ICD-10-CM | POA: Insufficient documentation

## 2016-10-26 DIAGNOSIS — I739 Peripheral vascular disease, unspecified: Secondary | ICD-10-CM | POA: Diagnosis not present

## 2016-10-26 DIAGNOSIS — M109 Gout, unspecified: Secondary | ICD-10-CM

## 2016-10-26 DIAGNOSIS — Z87891 Personal history of nicotine dependence: Secondary | ICD-10-CM | POA: Diagnosis not present

## 2016-10-26 DIAGNOSIS — Z8249 Family history of ischemic heart disease and other diseases of the circulatory system: Secondary | ICD-10-CM | POA: Diagnosis not present

## 2016-10-26 DIAGNOSIS — Z8719 Personal history of other diseases of the digestive system: Secondary | ICD-10-CM | POA: Diagnosis not present

## 2016-10-26 DIAGNOSIS — H04129 Dry eye syndrome of unspecified lacrimal gland: Secondary | ICD-10-CM | POA: Diagnosis not present

## 2016-10-26 LAB — APTT: APTT: 30 s (ref 24–36)

## 2016-10-26 LAB — BASIC METABOLIC PANEL
Anion gap: 8 (ref 5–15)
BUN: 9 mg/dL (ref 6–20)
CALCIUM: 9.3 mg/dL (ref 8.9–10.3)
CO2: 27 mmol/L (ref 22–32)
Chloride: 103 mmol/L (ref 101–111)
Creatinine, Ser: 0.78 mg/dL (ref 0.61–1.24)
GFR calc Af Amer: >60 mL/min (ref >60)
Glucose, Bld: 115 mg/dL — ABNORMAL HIGH (ref 65–99)
Potassium: 3.9 mmol/L (ref 3.5–5.1)
Sodium: 138 mmol/L (ref 135–145)

## 2016-10-26 LAB — CBC
HEMATOCRIT: 41.3 % (ref 39.0–52.0)
Hemoglobin: 14.5 g/dL (ref 13.0–17.0)
MCH: 30.7 pg (ref 26.0–34.0)
MCHC: 35.1 g/dL (ref 30.0–36.0)
MCV: 87.3 fL (ref 78.0–100.0)
Platelets: 141 10*3/uL — ABNORMAL LOW (ref 150–400)
RBC: 4.73 MIL/uL (ref 4.22–5.81)
RDW: 14.6 % (ref 11.5–15.5)
WBC: 6.6 10*3/uL (ref 4.0–10.5)

## 2016-10-26 LAB — PROTIME-INR
INR: 1.12
PROTHROMBIN TIME: 14.4 s (ref 11.4–15.2)

## 2016-10-26 LAB — ABO/RH: ABO/RH(D): AB POS

## 2016-10-27 ENCOUNTER — Other Ambulatory Visit: Payer: BLUE CROSS/BLUE SHIELD | Admitting: *Deleted

## 2016-10-27 DIAGNOSIS — E785 Hyperlipidemia, unspecified: Secondary | ICD-10-CM

## 2016-10-27 NOTE — H&P (Signed)
CC/HPI: CC: Prostate Cancer     Matthew Johnson is a 56 year old gentleman who was noted to have an elevated screening PSA of 81.47. He was seen by Dr. Diona Fanti and was noted to have a normal DRE. His PSA was repeated and remained elevated at 51.7 prompting a TRUS biopsy of the prostate on 07/07/16 that demonstrated right sided hypoechoic areas and confirmed Gleason 3+4=7 adenocarcinoma of the prostate with 6 out of 12 biopsy cores positive for malignancy.   Family history: None   Imaging studies:  CT imaging of the abdomen and pelvis (07/21/16) - Cirrhosis and portal venous hypertension, right colon wall thickening, no lymphadenopathy or other evidence of metastatic prostate cancer, coronary artery disease and aortic atherosclerotic disease  Bone scan (07/21/16) - rib uptake consistent with recent rib fractures without any convincing evidence of metastatic disease   PMH: He has a history of GERD, hypertension, gout, hypercholesterolemia, and history of peptic ulcer, coronary and peripheral vascular disease by radiologic imaging (family history of CAD). He has no known history of cirrhosis.  PSH: Appendectomy.   TNM stage: cT1c N0 M0  PSA: 51.7  Gleason score: 3+4=7  Biopsy (07/07/16): 6/12 cores positive  Left: L apex (10%, 3+4=7)  Right: R apex (50%, 3+4=7), R lateral apex (50%, 3+3=6), R mid (70%, 3+4=7), R lateral mid (50%, 3+4=7), R lateral base (<5%, 3+4=7)  Prostate volume: 55.3 cc   Nomogram  OC disease: 4%  EPE: 95%  SVI: 28%  LNI: 26%  PFS (5 year, 10 year): 42%,28%   Urinary function: IPSS is 9.  Erectile function: SHIM score is 25.     ALLERGIES: None   MEDICATIONS: Aspirin 81 mg tablet, chewable  Aciphex 1 PO PRN  Bc  Diltiazem 24Hr Cd 360 mg capsule, ext release 24 hr  Fish Oil  Glucosamine  Indomethacin 25 mg capsule  Lovastatin 20 mg tablet  Multiple Vitamin  Potassium  Valsartan 160 mg tablet  Vitamin B12     GU PSH: Locm 300-399Mg /Ml Iodine,1Ml -  07/21/2016 Prostate Needle Biopsy - 07/07/2016      PSH Notes: Ankle; Leg; Hand   NON-GU PSH: Appendectomy Surgical Pathology, Gross And Microscopic Examination For Prostate Needle - 07/07/2016    GU PMH: BPH w/LUTS - 06/02/2016      PMH Notes: Esophageal Reflux; Gout; Gastric Ulcer   NON-GU PMH: Hypercholesterolemia Hypertension    FAMILY HISTORY: 1 Daughter - Runs in Family Death In The Family Father - Father Heart Attack - Father Hypertension - Mother   SOCIAL HISTORY: Marital Status: Married Current Smoking Status: Patient does not smoke anymore. Has not smoked since 05/28/1992.  Does drink.  Drinks 1 caffeinated drink per day.    REVIEW OF SYSTEMS:    GU Review Male:   Patient denies frequent urination, hard to postpone urination, burning/ pain with urination, get up at night to urinate, leakage of urine, stream starts and stops, trouble starting your streams, and have to strain to urinate .  Gastrointestinal (Lower):   Patient denies diarrhea and constipation.  Gastrointestinal (Upper):   Patient denies nausea and vomiting.  Constitutional:   Patient denies fever, night sweats, weight loss, and fatigue.  Skin:   Patient denies itching and skin rash/ lesion.  Eyes:   Patient denies blurred vision and double vision.  Ears/ Nose/ Throat:   Patient denies sore throat and sinus problems.  Hematologic/Lymphatic:   Patient denies swollen glands and easy bruising.  Cardiovascular:   Patient denies leg swelling  and chest pains.  Respiratory:   Patient denies cough and shortness of breath.  Endocrine:   Patient denies excessive thirst.  Musculoskeletal:   Patient denies back pain and joint pain.  Neurological:   Patient denies headaches and dizziness.  Psychologic:   Patient denies depression and anxiety.  MULTI-SYSTEM PHYSICAL EXAMINATION:    Constitutional: Well-nourished. No physical deformities. Normally developed. Good grooming.  Neck: Neck symmetrical, not swollen.  Normal tracheal position.  Respiratory: No labored breathing, no use of accessory muscles. Clear bilaterally.  Cardiovascular: Normal temperature, normal extremity pulses, no swelling, no varicosities. Regular rate and rhythm.  Lymphatic: No enlargement of neck, axillae, groin.  Skin: No paleness, no jaundice, no cyanosis. No lesion, no ulcer, no rash.  Neurologic / Psychiatric: Oriented to time, oriented to place, oriented to person. No depression, no anxiety, no agitation.  Gastrointestinal: No mass, no tenderness, no rigidity, non obese abdomen. Right lower quadrant scar well-healed.  Eyes: Normal conjunctivae. Normal eyelids.  Ears, Nose, Mouth, and Throat: Left ear no scars, no lesions, no masses. Right ear no scars, no lesions, no masses. Nose no scars, no lesions, no masses. Normal hearing. Normal lips.  Musculoskeletal: Normal gait and station of head and neck.      ASSESSMENT:      ICD-10 Details  1 GU:   Prostate Cancer - C61    PLAN:       1. Prostate cancer: He has elected surgical therapy and has undergone a cardiac risk assessment which indicated a low risk for cardiac complications perioperatively.  He will undergo a robot-assisted laparoscopic radical prostatectomy and bilateral pelvic lymphadenectomy.

## 2016-10-28 ENCOUNTER — Observation Stay (HOSPITAL_COMMUNITY)
Admission: RE | Admit: 2016-10-28 | Discharge: 2016-10-29 | Disposition: A | Discharge status: Home / Self Care | Payer: BLUE CROSS/BLUE SHIELD | Source: Ambulatory Visit | Attending: Urology | Admitting: Urology

## 2016-10-28 ENCOUNTER — Encounter (HOSPITAL_COMMUNITY): Payer: Self-pay | Admitting: *Deleted

## 2016-10-28 ENCOUNTER — Encounter (HOSPITAL_COMMUNITY)
Admission: RE | Disposition: A | Discharge status: Home / Self Care | Payer: Self-pay | Source: Ambulatory Visit | Attending: Urology

## 2016-10-28 ENCOUNTER — Ambulatory Visit (HOSPITAL_COMMUNITY): Payer: BLUE CROSS/BLUE SHIELD | Admitting: Anesthesiology

## 2016-10-28 DIAGNOSIS — I739 Peripheral vascular disease, unspecified: Secondary | ICD-10-CM | POA: Insufficient documentation

## 2016-10-28 DIAGNOSIS — Z7982 Long term (current) use of aspirin: Secondary | ICD-10-CM | POA: Insufficient documentation

## 2016-10-28 DIAGNOSIS — K219 Gastro-esophageal reflux disease without esophagitis: Secondary | ICD-10-CM | POA: Insufficient documentation

## 2016-10-28 DIAGNOSIS — I2510 Atherosclerotic heart disease of native coronary artery without angina pectoris: Secondary | ICD-10-CM | POA: Insufficient documentation

## 2016-10-28 DIAGNOSIS — I10 Essential (primary) hypertension: Secondary | ICD-10-CM | POA: Insufficient documentation

## 2016-10-28 DIAGNOSIS — Z8719 Personal history of other diseases of the digestive system: Secondary | ICD-10-CM | POA: Insufficient documentation

## 2016-10-28 DIAGNOSIS — M109 Gout, unspecified: Secondary | ICD-10-CM | POA: Insufficient documentation

## 2016-10-28 DIAGNOSIS — Z8249 Family history of ischemic heart disease and other diseases of the circulatory system: Secondary | ICD-10-CM | POA: Insufficient documentation

## 2016-10-28 DIAGNOSIS — E7800 Pure hypercholesterolemia, unspecified: Secondary | ICD-10-CM | POA: Insufficient documentation

## 2016-10-28 DIAGNOSIS — H04129 Dry eye syndrome of unspecified lacrimal gland: Secondary | ICD-10-CM | POA: Insufficient documentation

## 2016-10-28 DIAGNOSIS — C61 Malignant neoplasm of prostate: Secondary | ICD-10-CM | POA: Diagnosis not present

## 2016-10-28 DIAGNOSIS — Z87891 Personal history of nicotine dependence: Secondary | ICD-10-CM | POA: Insufficient documentation

## 2016-10-28 HISTORY — PX: LYMPHADENECTOMY: SHX5960

## 2016-10-28 HISTORY — PX: ROBOT ASSISTED LAPAROSCOPIC RADICAL PROSTATECTOMY: SHX5141

## 2016-10-28 LAB — NMR, LIPOPROFILE
CHOLESTEROL: 178 mg/dL (ref 100–199)
HDL Cholesterol by NMR: 44 mg/dL (ref >39)
HDL PARTICLE NUMBER: 23.1 umol/L — AB (ref >=30.5)
LDL Particle Number: 1571 nmol/L — ABNORMAL HIGH (ref <1000)
LDL SIZE: 19.9 nm (ref >20.5)
LDL-C: 107 mg/dL — ABNORMAL HIGH (ref 0–99)
LP-IR Score: 55 — ABNORMAL HIGH (ref <=45)
SMALL LDL PARTICLE NUMBER: 1070 nmol/L — AB (ref <=527)
TRIGLYCERIDES BY NMR: 135 mg/dL (ref 0–149)

## 2016-10-28 LAB — TYPE AND SCREEN
ABO/RH(D): AB POS
Antibody Screen: NEGATIVE

## 2016-10-28 LAB — HEMOGLOBIN AND HEMATOCRIT, BLOOD
HEMATOCRIT: 38.5 % — AB (ref 39.0–52.0)
HEMOGLOBIN: 13.4 g/dL (ref 13.0–17.0)

## 2016-10-28 LAB — ALT

## 2016-10-28 LAB — APOLIPOPROTEIN B

## 2016-10-28 LAB — APOLIPOPROTEIN A-1

## 2016-10-28 SURGERY — XI ROBOTIC ASSISTED LAPAROSCOPIC RADICAL PROSTATECTOMY LEVEL 3
Anesthesia: General

## 2016-10-28 MED ORDER — SODIUM CHLORIDE 0.9 % IR SOLN
Status: DC | PRN
  Administered 2016-10-28: 1000 mL via INTRAVESICAL

## 2016-10-28 MED ORDER — IRBESARTAN 150 MG PO TABS
150.0000 mg | ORAL_TABLET | Freq: Every day | ORAL | Status: DC
  Administered 2016-10-28 – 2016-10-29 (×2): 150 mg via ORAL
  Filled 2016-10-28 (×2): qty 1

## 2016-10-28 MED ORDER — HYDROCODONE-ACETAMINOPHEN 5-325 MG PO TABS
1.0000 | ORAL_TABLET | ORAL | Status: DC | PRN
  Administered 2016-10-29 (×3): 2 via ORAL
  Filled 2016-10-28 (×2): qty 2

## 2016-10-28 MED ORDER — OXYCODONE-ACETAMINOPHEN 5-325 MG PO TABS: ORAL_TABLET | ORAL | 0 refills | Status: AC

## 2016-10-28 MED ORDER — MIDAZOLAM HCL 2 MG/2ML IJ SOLN
INTRAMUSCULAR | Status: AC
  Filled 2016-10-28: qty 2

## 2016-10-28 MED ORDER — PROPOFOL 10 MG/ML IV BOLUS
INTRAVENOUS | Status: DC | PRN
  Administered 2016-10-28: 180 mg via INTRAVENOUS

## 2016-10-28 MED ORDER — SUFENTANIL CITRATE 50 MCG/ML IV SOLN
INTRAVENOUS | Status: DC | PRN
  Administered 2016-10-28 (×3): 10 ug via INTRAVENOUS
  Administered 2016-10-28: 20 ug via INTRAVENOUS

## 2016-10-28 MED ORDER — ROCURONIUM BROMIDE 10 MG/ML (PF) SYRINGE
PREFILLED_SYRINGE | INTRAVENOUS | Status: DC | PRN
  Administered 2016-10-28: 50 mg via INTRAVENOUS
  Administered 2016-10-28: 10 mg via INTRAVENOUS
  Administered 2016-10-28 (×2): 20 mg via INTRAVENOUS
  Administered 2016-10-28: 30 mg via INTRAVENOUS
  Administered 2016-10-28 (×2): 10 mg via INTRAVENOUS

## 2016-10-28 MED ORDER — SODIUM CHLORIDE 0.9 % IV BOLUS (SEPSIS)
1000.0000 mL | Freq: Once | INTRAVENOUS | Status: AC
  Administered 2016-10-28: 1000 mL via INTRAVENOUS

## 2016-10-28 MED ORDER — ACETAMINOPHEN 325 MG PO TABS: 650.0000 mg | ORAL_TABLET | ORAL | Status: DC | PRN

## 2016-10-28 MED ORDER — SUGAMMADEX SODIUM 500 MG/5ML IV SOLN
INTRAVENOUS | Status: AC
  Filled 2016-10-28: qty 5

## 2016-10-28 MED ORDER — DEXAMETHASONE SODIUM PHOSPHATE 10 MG/ML IJ SOLN
INTRAMUSCULAR | Status: DC | PRN
  Administered 2016-10-28: 10 mg via INTRAVENOUS

## 2016-10-28 MED ORDER — HYDROMORPHONE HCL 2 MG/ML IJ SOLN
INTRAMUSCULAR | Status: AC
  Filled 2016-10-28: qty 1

## 2016-10-28 MED ORDER — FENTANYL CITRATE (PF) 100 MCG/2ML IJ SOLN
INTRAMUSCULAR | Status: AC
  Filled 2016-10-28: qty 2

## 2016-10-28 MED ORDER — ONDANSETRON HCL 4 MG/2ML IJ SOLN: 4.0000 mg | INTRAMUSCULAR | Status: DC | PRN

## 2016-10-28 MED ORDER — SUGAMMADEX SODIUM 200 MG/2ML IV SOLN
INTRAVENOUS | Status: DC | PRN
  Administered 2016-10-28: 300 mg via INTRAVENOUS

## 2016-10-28 MED ORDER — PRAVASTATIN SODIUM 20 MG PO TABS
20.0000 mg | ORAL_TABLET | Freq: Every day | ORAL | Status: DC
  Administered 2016-10-28: 20 mg via ORAL
  Filled 2016-10-28: qty 1

## 2016-10-28 MED ORDER — SODIUM CHLORIDE 0.9 % IJ SOLN
INTRAMUSCULAR | Status: AC
  Filled 2016-10-28: qty 10

## 2016-10-28 MED ORDER — BUPIVACAINE HCL (PF) 0.25 % IJ SOLN
INTRAMUSCULAR | Status: DC | PRN
  Administered 2016-10-28: 25 mL

## 2016-10-28 MED ORDER — LIDOCAINE 2% (20 MG/ML) 5 ML SYRINGE
INTRAMUSCULAR | Status: AC
  Filled 2016-10-28: qty 5

## 2016-10-28 MED ORDER — BUPIVACAINE HCL (PF) 0.25 % IJ SOLN
INTRAMUSCULAR | Status: AC
  Filled 2016-10-28: qty 30

## 2016-10-28 MED ORDER — MIDAZOLAM HCL 2 MG/2ML IJ SOLN
INTRAMUSCULAR | Status: DC | PRN
  Administered 2016-10-28: 2 mg via INTRAVENOUS

## 2016-10-28 MED ORDER — LACTATED RINGERS IV SOLN
INTRAVENOUS | Status: DC | PRN
  Administered 2016-10-28: 1000 mL

## 2016-10-28 MED ORDER — FENTANYL CITRATE (PF) 100 MCG/2ML IJ SOLN
25.0000 ug | INTRAMUSCULAR | Status: DC | PRN
  Administered 2016-10-28 (×3): 50 ug via INTRAVENOUS

## 2016-10-28 MED ORDER — SUCCINYLCHOLINE CHLORIDE 200 MG/10ML IV SOSY
PREFILLED_SYRINGE | INTRAVENOUS | Status: DC | PRN
  Administered 2016-10-28: 140 mg via INTRAVENOUS

## 2016-10-28 MED ORDER — DIPHENHYDRAMINE HCL 50 MG/ML IJ SOLN: 12.5000 mg | Freq: Four times a day (QID) | INTRAMUSCULAR | Status: DC | PRN

## 2016-10-28 MED ORDER — HYDROMORPHONE HCL 1 MG/ML IJ SOLN
INTRAMUSCULAR | Status: DC | PRN
  Administered 2016-10-28 (×4): 0.5 mg via INTRAVENOUS

## 2016-10-28 MED ORDER — FLUORESCEIN SODIUM 10 % IV SOLN
INTRAVENOUS | Status: DC | PRN
  Administered 2016-10-28: 50 mg via INTRAVENOUS

## 2016-10-28 MED ORDER — DILTIAZEM HCL ER COATED BEADS 180 MG PO CP24
360.0000 mg | ORAL_CAPSULE | Freq: Every day | ORAL | Status: DC
  Administered 2016-10-29: 360 mg via ORAL
  Filled 2016-10-28: qty 2

## 2016-10-28 MED ORDER — PROPOFOL 10 MG/ML IV BOLUS
INTRAVENOUS | Status: AC
  Filled 2016-10-28: qty 20

## 2016-10-28 MED ORDER — CEFAZOLIN SODIUM-DEXTROSE 2-4 GM/100ML-% IV SOLN
INTRAVENOUS | Status: AC
  Filled 2016-10-28: qty 100

## 2016-10-28 MED ORDER — ONDANSETRON HCL 4 MG/2ML IJ SOLN
INTRAMUSCULAR | Status: AC
  Filled 2016-10-28: qty 2

## 2016-10-28 MED ORDER — DIPHENHYDRAMINE HCL 12.5 MG/5ML PO ELIX: 12.5000 mg | ORAL_SOLUTION | Freq: Four times a day (QID) | ORAL | Status: DC | PRN

## 2016-10-28 MED ORDER — LIDOCAINE 2% (20 MG/ML) 5 ML SYRINGE
INTRAMUSCULAR | Status: DC | PRN
  Administered 2016-10-28: 100 mg via INTRAVENOUS

## 2016-10-28 MED ORDER — HEPARIN SODIUM (PORCINE) 1000 UNIT/ML IJ SOLN
INTRAMUSCULAR | Status: AC
  Filled 2016-10-28: qty 1

## 2016-10-28 MED ORDER — METOCLOPRAMIDE HCL 5 MG/ML IJ SOLN: 10.0000 mg | Freq: Once | INTRAMUSCULAR | Status: DC | PRN

## 2016-10-28 MED ORDER — LACTATED RINGERS IV SOLN
INTRAVENOUS | Status: DC | PRN
  Administered 2016-10-28 (×3): via INTRAVENOUS

## 2016-10-28 MED ORDER — ROCURONIUM BROMIDE 50 MG/5ML IV SOSY
PREFILLED_SYRINGE | INTRAVENOUS | Status: AC
  Filled 2016-10-28: qty 5

## 2016-10-28 MED ORDER — ONDANSETRON HCL 4 MG/2ML IJ SOLN
INTRAMUSCULAR | Status: DC | PRN
  Administered 2016-10-28: 4 mg via INTRAVENOUS

## 2016-10-28 MED ORDER — POTASSIUM 99 MG PO TABS: 99.0000 mg | ORAL_TABLET | Freq: Every day | ORAL | Status: DC

## 2016-10-28 MED ORDER — FENTANYL CITRATE (PF) 100 MCG/2ML IJ SOLN
25.0000 ug | INTRAMUSCULAR | Status: DC | PRN
Start: 2016-10-28 — End: 2016-10-28
  Administered 2016-10-28 (×2): 25 ug via INTRAVENOUS

## 2016-10-28 MED ORDER — SUFENTANIL CITRATE 50 MCG/ML IV SOLN
INTRAVENOUS | Status: AC
  Filled 2016-10-28: qty 1

## 2016-10-28 MED ORDER — CEFAZOLIN SODIUM-DEXTROSE 2-4 GM/100ML-% IV SOLN
2.0000 g | INTRAVENOUS | Status: AC
  Administered 2016-10-28 (×2): 2 g via INTRAVENOUS
  Filled 2016-10-28: qty 100

## 2016-10-28 MED ORDER — MEPERIDINE HCL 50 MG/ML IJ SOLN: 6.2500 mg | INTRAMUSCULAR | Status: DC | PRN

## 2016-10-28 MED ORDER — DEXTROSE-NACL 5-0.45 % IV SOLN
INTRAVENOUS | Status: DC
  Administered 2016-10-28 – 2016-10-29 (×2): via INTRAVENOUS

## 2016-10-28 MED ORDER — POTASSIUM CHLORIDE CRYS ER 10 MEQ PO TBCR
10.0000 meq | EXTENDED_RELEASE_TABLET | Freq: Every day | ORAL | Status: DC
  Administered 2016-10-29: 10 meq via ORAL
  Filled 2016-10-28 (×2): qty 1

## 2016-10-28 MED ORDER — FLUMAZENIL 0.5 MG/5ML IV SOLN
INTRAVENOUS | Status: AC
  Filled 2016-10-28: qty 5

## 2016-10-28 MED ORDER — LACTATED RINGERS IV SOLN: INTRAVENOUS | Status: DC

## 2016-10-28 MED ORDER — FLUORESCEIN SODIUM 10 % IV SOLN
INTRAVENOUS | Status: AC
  Filled 2016-10-28: qty 5

## 2016-10-28 MED ORDER — DEXAMETHASONE SODIUM PHOSPHATE 10 MG/ML IJ SOLN
INTRAMUSCULAR | Status: AC
  Filled 2016-10-28: qty 1

## 2016-10-28 MED ORDER — SULFAMETHOXAZOLE-TRIMETHOPRIM 800-160 MG PO TABS: 1.0000 | ORAL_TABLET | Freq: Two times a day (BID) | ORAL | 0 refills | Status: AC

## 2016-10-28 MED ORDER — MORPHINE SULFATE (PF) 2 MG/ML IV SOLN
2.0000 mg | INTRAVENOUS | Status: DC | PRN
  Administered 2016-10-28: 4 mg via INTRAVENOUS
  Administered 2016-10-28: 2 mg via INTRAVENOUS
  Administered 2016-10-29: 4 mg via INTRAVENOUS
  Filled 2016-10-28: qty 1
  Filled 2016-10-28 (×2): qty 2
  Filled 2016-10-28: qty 1

## 2016-10-28 MED ORDER — PANTOPRAZOLE SODIUM 40 MG PO TBEC
40.0000 mg | DELAYED_RELEASE_TABLET | Freq: Every day | ORAL | Status: DC
  Administered 2016-10-29: 40 mg via ORAL
  Filled 2016-10-28: qty 1

## 2016-10-28 SURGICAL SUPPLY — 59 items
APPLICATOR COTTON TIP 6IN STRL (MISCELLANEOUS) ×5 IMPLANT
CATH FOLEY 2WAY SLVR 18FR 30CC (CATHETERS) ×5 IMPLANT
CATH ROBINSON RED A/P 16FR (CATHETERS) ×5 IMPLANT
CATH ROBINSON RED A/P 8FR (CATHETERS) ×5 IMPLANT
CATH TIEMANN FOLEY 18FR 5CC (CATHETERS) ×5 IMPLANT
CHLORAPREP W/TINT 26ML (MISCELLANEOUS) ×5 IMPLANT
CLIP LIGATING HEM O LOK PURPLE (MISCELLANEOUS) ×10 IMPLANT
COVER SURGICAL LIGHT HANDLE (MISCELLANEOUS) ×5 IMPLANT
COVER TIP SHEARS 8 DVNC (MISCELLANEOUS) ×3 IMPLANT
COVER TIP SHEARS 8MM DA VINCI (MISCELLANEOUS) ×2
CUTTER ECHEON FLEX ENDO 45 340 (ENDOMECHANICALS) ×5 IMPLANT
DECANTER SPIKE VIAL GLASS SM (MISCELLANEOUS) IMPLANT
DERMABOND ADVANCED (GAUZE/BANDAGES/DRESSINGS)
DERMABOND ADVANCED .7 DNX12 (GAUZE/BANDAGES/DRESSINGS) IMPLANT
DRAPE ARM DVNC X/XI (DISPOSABLE) ×12 IMPLANT
DRAPE COLUMN DVNC XI (DISPOSABLE) ×3 IMPLANT
DRAPE DA VINCI XI ARM (DISPOSABLE) ×8
DRAPE DA VINCI XI COLUMN (DISPOSABLE) ×2
DRAPE SURG IRRIG POUCH 19X23 (DRAPES) ×5 IMPLANT
DRSG TEGADERM 4X4.75 (GAUZE/BANDAGES/DRESSINGS) ×5 IMPLANT
ELECT REM PT RETURN 9FT ADLT (ELECTROSURGICAL) ×5
ELECTRODE REM PT RTRN 9FT ADLT (ELECTROSURGICAL) ×3 IMPLANT
GLOVE BIO SURGEON STRL SZ 6.5 (GLOVE) ×4 IMPLANT
GLOVE BIO SURGEONS STRL SZ 6.5 (GLOVE) ×1
GLOVE BIOGEL M STRL SZ7.5 (GLOVE) ×10 IMPLANT
GOWN STRL REUS W/TWL LRG LVL3 (GOWN DISPOSABLE) ×15 IMPLANT
GUIDEWIRE STR DUAL SENSOR (WIRE) ×5 IMPLANT
HOLDER FOLEY CATH W/STRAP (MISCELLANEOUS) ×5 IMPLANT
IRRIG SUCT STRYKERFLOW 2 WTIP (MISCELLANEOUS) ×5
IRRIGATION SUCT STRKRFLW 2 WTP (MISCELLANEOUS) ×3 IMPLANT
IV LACTATED RINGERS 1000ML (IV SOLUTION) ×5 IMPLANT
NDL SAFETY ECLIPSE 18X1.5 (NEEDLE) ×3 IMPLANT
NEEDLE HYPO 18GX1.5 SHARP (NEEDLE) ×2
PACK ROBOT UROLOGY CUSTOM (CUSTOM PROCEDURE TRAY) ×5 IMPLANT
RELOAD GREEN ECHELON 45 (STAPLE) ×5 IMPLANT
SEAL CANN UNIV 5-8 DVNC XI (MISCELLANEOUS) ×12 IMPLANT
SEAL XI 5MM-8MM UNIVERSAL (MISCELLANEOUS) ×8
SOLUTION ELECTROLUBE (MISCELLANEOUS) ×5 IMPLANT
STENT URET 6FRX26 CONTOUR (STENTS) ×5 IMPLANT
SUT ETHILON 3 0 PS 1 (SUTURE) ×5 IMPLANT
SUT MNCRL 3 0 RB1 (SUTURE) ×3 IMPLANT
SUT MNCRL 3 0 VIOLET RB1 (SUTURE) ×3 IMPLANT
SUT MNCRL AB 4-0 PS2 18 (SUTURE) ×10 IMPLANT
SUT MON AB 4-0 RB1 27 (SUTURE) ×5 IMPLANT
SUT MONOCRYL 3 0 RB1 (SUTURE) ×4
SUT VIC AB 0 CT1 27 (SUTURE) ×2
SUT VIC AB 0 CT1 27XBRD ANTBC (SUTURE) ×3 IMPLANT
SUT VIC AB 0 UR5 27 (SUTURE) ×5 IMPLANT
SUT VIC AB 2-0 SH 27 (SUTURE) ×2
SUT VIC AB 2-0 SH 27X BRD (SUTURE) ×3 IMPLANT
SUT VIC AB 3-0 SH 27 (SUTURE) ×2
SUT VIC AB 3-0 SH 27X BRD (SUTURE) ×3 IMPLANT
SUT VICRYL 0 UR6 27IN ABS (SUTURE) ×10 IMPLANT
SYR 27GX1/2 1ML LL SAFETY (SYRINGE) ×5 IMPLANT
TOWEL OR 17X26 10 PK STRL BLUE (TOWEL DISPOSABLE) ×5 IMPLANT
TOWEL OR NON WOVEN STRL DISP B (DISPOSABLE) ×5 IMPLANT
TUBING INSUFFLATION 10FT LAP (TUBING) ×5 IMPLANT
WATER STERILE IRR 1000ML POUR (IV SOLUTION) ×5 IMPLANT
WATER STERILE IRR 1500ML POUR (IV SOLUTION) IMPLANT

## 2016-10-28 NOTE — Op Note (Signed)
Preoperative diagnosis: Clinically localized adenocarcinoma of the prostate (clinical stage T1c N0 M0)  Postoperative diagnosis: Clinically localized adenocarcinoma of the prostate (clinical stage T1c N0 M0)  Procedure:  1. Robotic assisted laparoscopic radical prostatectomy (Bilateral nerve sparing) 2. Bilateral robotic assisted laparoscopic extended pelvic lymphadenectomy 3. Left ureteral stent placement (6 x 26)  Surgeon: Roxy Horseman, Brooke Bonito. M.D.  Assistant(s): Debbrah Alar, PA-C  Anesthesia: General  Complications: None  EBL: 150 mL  IVF:  2500 mL crystalloid  Specimens: 1. Prostate and seminal vesicles 2. Right pelvic lymph nodes 3. Left pelvic lymph nodes  Disposition of specimens: Pathology  Drains: 1. 20 Fr coude catheter 2. # 19 Blake pelvic drain  Indication: Matthew Johnson is a 56 y.o. patient with clinically localized prostate cancer.  After a thorough review of the management options for treatment of prostate cancer, he elected to proceed with surgical therapy and the above procedure(s).  We have discussed the potential benefits and risks of the procedure, side effects of the proposed treatment, the likelihood of the patient achieving the goals of the procedure, and any potential problems that might occur during the procedure or recuperation. Informed consent has been obtained.  Description of procedure:  The patient was taken to the operating room and a general anesthetic was administered. He was given preoperative antibiotics, placed in the dorsal lithotomy position, and prepped and draped in the usual sterile fashion. Next a preoperative timeout was performed. A urethral catheter was placed into the bladder and a site was selected near the umbilicus for placement of the camera port. This was placed using a standard open Hassan technique which allowed entry into the peritoneal cavity under direct vision and without difficulty. An 8 mm port was placed and a  pneumoperitoneum established. The camera was then used to inspect the abdomen and there was no evidence of any intra-abdominal injuries or other abnormalities. The remaining abdominal ports were then placed. 8 mm robotic ports were placed in the right lower quadrant, left lower quadrant, and far left lateral abdominal wall. A 5 mm port was placed in the right upper quadrant and a 12 mm port was placed in the right lateral abdominal wall for laparoscopic assistance. All ports were placed under direct vision without difficulty. The surgical cart was then docked.   Utilizing the cautery scissors, the bladder was reflected posteriorly allowing entry into the space of Retzius and identification of the endopelvic fascia and prostate. The periprostatic fat was then removed from the prostate allowing full exposure of the endopelvic fascia. The endopelvic fascia was then incised from the apex back to the base of the prostate bilaterally and the underlying levator muscle fibers were swept laterally off the prostate thereby isolating the dorsal venous complex. The dorsal vein was then stapled and divided with a 45 mm Flex Echelon stapler. Attention then turned to the bladder neck which was divided anteriorly thereby allowing entry into the bladder and exposure of the urethral catheter. The catheter balloon was deflated and the catheter was brought into the operative field and used to retract the prostate anteriorly. The posterior bladder neck was then examined and was divided allowing further dissection between the bladder and prostate posteriorly until the vasa deferentia and seminal vessels were identified. The vasa deferentia were isolated, divided, and lifted anteriorly. The seminal vesicles were dissected down to their tips with care to control the seminal vascular arterial blood supply. These structures were then lifted anteriorly and the space between Denonvillier's fascia and the  anterior rectum was developed with a  combination of sharp and blunt dissection. This isolated the vascular pedicles of the prostate.  The lateral prostatic fascia was then sharply incised allowing release of the neurovascular bundles bilaterally. The vascular pedicles of the prostate were then ligated with Weck clips between the prostate and neurovascular bundles and divided with sharp cold scissor dissection resulting in neurovascular bundle preservation. The neurovascular bundles were then separated off the apex of the prostate and urethra bilaterally.  The urethra was then sharply transected allowing the prostate specimen to be disarticulated. The pelvis was copiously irrigated and hemostasis was ensured. There was no evidence for rectal injury.  Attention then turned to the right pelvic sidewall. The fibrofatty tissue extending from the genitofemoral nerve laterally to the confluence of the iliac vessels proximally to the hypogastric artery posteriorly and Cooper's ligament distally was dissected free from the pelvic sidewall with care to preserve the obturator nerve and major vascular structures. Weck clips were used for lymphostasis and hemostasis. An identical procedure was then performed on the contralateral side and the lymphatic packets were removed for permanent pathologic analysis.  Attention then turned to the urethral anastomosis. A 2-0 Vicryl slip knot was placed between Denonvillier's fascia, the posterior bladder neck, and the posterior urethra to reapproximate these structures. A double-armed 3-0 Monocryl suture was then used to perform a 360 running tension-free anastomosis between the bladder neck and urethra.  The patient's ureteral orifices were noted to be very close to the bladder neck and were identified.  The patient's bladder neck was extremely thin and was prone to tear with any tension.  After an attempt to perform a full running anastomosis, I realized this would not be successful.  I was able to complete the  right side of the anastomosis with care.  The left side was thinner and could not be brought together in a standard fashion.  With the ureteral orifice very close to the edge of the bladder neck, I decided to place a ureteral stent to protect the ureter during bladder neck reconstruction.  I placed a 0.38 Sensor guidewire and placed a 6x26 Contour stent over the wire with Seldinger technique.  The wire and stent were placed through the 12 mm assistant port and advanced up the ureter robotically.  I then reaproximmated the left side of the bladder to itself to incorporate the ureteral orifice into the bladder and away from the neo-bladder neck.  I then completed the left side of the anastomosis in a standard running fashion.  A new urethral catheter was then placed into the bladder and irrigated. There were no blood clots within the bladder and the anastomosis appeared to be watertight. A #19 Blake drain was then brought through the left lateral 8 mm port site and positioned appropriately within the pelvis. It was secured to the skin with a nylon suture. The surgical cart was then undocked. The right lateral 12 mm port site was closed at the fascial level with a 0 Vicryl suture placed laparoscopically. All remaining ports were then removed under direct vision. The prostate specimen was removed intact within the Endopouch retrieval bag via the periumbilical camera port site. This fascial opening was closed with two running 0 Vicryl sutures. 0.25% Marcaine was then injected into all port sites and all incisions were reapproximated at the skin level with staples. Sterile dressings were applied. The patient appeared to tolerate the procedure well and without complications. The patient was able to be extubated and transferred  to the recovery unit in satisfactory condition.  Pryor Curia MD

## 2016-10-28 NOTE — Discharge Instructions (Signed)
1. Activity:  You are encouraged to ambulate frequently (about every hour during waking hours) to help prevent blood clots from forming in your legs or lungs.  However, you should not engage in any heavy lifting (> 10-15 lbs), strenuous activity, or straining. 2. Diet: You should continue a clear liquid diet until passing gas from below.  Once this occurs, you may advance your diet to a soft diet that would be easy to digest (i.e soups, scrambled eggs, mashed potatoes, etc.) for 24 hours just as you would if getting over a bad stomach flu.  If tolerating this diet well for 24 hours, you may then begin eating regular food.  It will be normal to have some amount of bloating, nausea, and abdominal discomfort intermittently. 3. Prescriptions:  You will be provided a prescription for pain medication to take as needed.  If your pain is not severe enough to require the prescription pain medication, you may take Tylenol instead.  You should also take an over the counter stool softener (Colace 100 mg twice daily) to avoid straining with bowel movements as the pain medication may constipate you. Finally, you will also be provided a prescription for an antibiotic to begin the day prior to your return visit in the office for catheter removal. 4. Catheter care: You will be taught how to take care of the catheter by the nursing staff prior to discharge from the hospital.  You may use both a leg bag and the larger bedside bag but it is recommended to at least use the bigger bedside bag at nighttime as the leg bag is small and will fill up overnight and also does not drain as well when lying flat. You may periodically feel a strong urge to void with the catheter in place.  This is a bladder spasm and most often can occur when having a bowel movement or when you are moving around. It is typically self-limited and usually will stop after a few minutes.  You may use some Vaseline or Neosporin around the tip of the catheter to  reduce friction at the tip of the penis. 5. Incisions: You may remove your dressing bandages the 2nd day after surgery.  You most likely will have a few small staples in each of the incisions and once the bandages are removed, the incisions may stay open to air.  You may start showering (not soaking or bathing in water) 48 hours after surgery and the incisions simply need to be patted dry after the shower.  No additional care is needed. 6. What to call us about: You should call the office [phone]) if you develop fever > 101, persistent vomiting, or the catheter stops draining. Also, feel free to call with any other questions you may have and remember the handout that was provided to you as a reference preoperatively which answers many of the common questions that arise after surgery. 7. You may resume Indocin, aspirin, advil, aleve, vitamins, and supplements 7 days after surgery.

## 2016-10-28 NOTE — Progress Notes (Signed)
Patient ID: Matthew Johnson, male   DOB: 1961-02-23, 56 y.o.   MRN: [id number]  Post-op note  Subjective: The patient is doing well.  No complaints.  Objective: Vital signs in last 24 hours: Temp:  [97.6 F (36.4 C)-98.3 F (36.8 C)] 98.3 F (36.8 C) (02/01 1726) Pulse Rate:  [58-67] 60 (02/01 1726) Resp:  [8-18] 12 (02/01 1726) BP: (124-153)/(63-85) 143/71 (02/01 1726) SpO2:  [94 %-100 %] 97 % (02/01 1726) Weight:  [112 kg (247 lb)] 112 kg (247 lb) (02/01 0922)  Intake/Output from previous day: No intake/output data recorded. Intake/Output this shift: Total I/O In: 3870 [I.V.:2850; Other:20; IV [id number] Out: 450 [Urine:300; Blood:150]  Physical Exam:  General: Alert and oriented. Abdomen: Soft, Nondistended. Incisions: Clean and dry.  Lab Results:  Recent Labs  10/26/16 1052 10/28/16 1609  HGB 14.5 13.4  HCT 41.3 38.5*    Assessment/Plan: POD#0   1) Continue to monitor, ambulate, IS   Pryor Curia. MD   LOS: 0 days   Kairi Tufo,LES 10/28/2016, 5:38 PM

## 2016-10-28 NOTE — Anesthesia Procedure Notes (Signed)
Procedure Name: Intubation Date/Time: 10/28/2016 11:07 AM Performed by: Danley Danker L Patient Re-evaluated:Patient Re-evaluated prior to inductionOxygen Delivery Method: Circle system utilized Preoxygenation: Pre-oxygenation with 100% oxygen Intubation Type: IV induction Ventilation: Mask ventilation without difficulty and Oral airway inserted - appropriate to patient size Laryngoscope Size: Miller and 3 Grade View: Grade I Tube type: Oral Tube size: 8.0 mm Number of attempts: 1 Airway Equipment and Method: Stylet Placement Confirmation: ETT inserted through vocal cords under direct vision,  positive ETCO2 and breath sounds checked- equal and bilateral Secured at: 21 cm Tube secured with: Tape Dental Injury: Teeth and Oropharynx as per pre-operative assessment

## 2016-10-28 NOTE — Anesthesia Preprocedure Evaluation (Addendum)
Anesthesia Evaluation  Patient identified by MRN, date of birth, ID band Patient awake    Reviewed: Allergy & Precautions, NPO status , Patient's Chart, lab work & pertinent test results  Airway Mallampati: II  TM Distance: >3 FB Neck ROM: Full    Dental no notable dental hx.    Pulmonary former smoker,    Pulmonary exam normal breath sounds clear to auscultation       Cardiovascular hypertension, Pt. on medications Normal cardiovascular exam Rhythm:Regular Rate:Normal     Neuro/Psych negative neurological ROS  negative psych ROS   GI/Hepatic negative GI ROS, Neg liver ROS,   Endo/Other  negative endocrine ROS  Renal/GU negative Renal ROS  negative genitourinary   Musculoskeletal negative musculoskeletal ROS (+)   Abdominal   Peds negative pediatric ROS (+)  Hematology negative hematology ROS (+)   Anesthesia Other Findings   Reproductive/Obstetrics negative OB ROS                            Anesthesia Physical Anesthesia Plan  ASA: II  Anesthesia Plan: General   Post-op Pain Management:    Induction: Intravenous  Airway Management Planned: Oral ETT  Additional Equipment:   Intra-op Plan:   Post-operative Plan: Extubation in OR  Informed Consent: I have reviewed the patients History and Physical, chart, labs and discussed the procedure including the risks, benefits and alternatives for the proposed anesthesia with the patient or authorized representative who has indicated his/her understanding and acceptance.   Dental advisory given  Plan Discussed with: CRNA  Anesthesia Plan Comments:         Anesthesia Quick Evaluation

## 2016-10-28 NOTE — Transfer of Care (Signed)
Immediate Anesthesia Transfer of Care Note  Patient: Matthew Johnson  Procedure(s) Performed: Procedure(s): XI ROBOTIC ASSISTED LAPAROSCOPIC RADICAL PROSTATECTOMY LEVEL 3 (N/A) PELVIC LYMPHADENECTOMY (Bilateral) URETERAL STENT PLACEMENT (Left)  Patient Location: PACU  Anesthesia Type:General  Level of Consciousness: awake  Airway & Oxygen Therapy: Patient Spontanous Breathing and Patient connected to face mask oxygen  Post-op Assessment: Report given to RN and Post -op Vital signs reviewed and stable  Post vital signs: Reviewed and stable  Last Vitals:  Vitals:   10/28/16 0922  BP: (!) 151/85  Pulse: 64  Resp: 18  Temp: 36.4 C    Last Pain:  Vitals:   10/28/16 0922  TempSrc: Oral         Complications: No apparent anesthesia complications

## 2016-10-28 NOTE — Anesthesia Postprocedure Evaluation (Signed)
Anesthesia Post Note  Patient: Matthew Johnson  Procedure(s) Performed: Procedure(s) (LRB): XI ROBOTIC ASSISTED LAPAROSCOPIC RADICAL PROSTATECTOMY LEVEL 3 (N/A) PELVIC LYMPHADENECTOMY (Bilateral) URETERAL STENT PLACEMENT (Left)  Patient location during evaluation: PACU Anesthesia Type: General Level of consciousness: awake and alert, oriented and patient cooperative Pain management: pain level controlled Vital Signs Assessment: post-procedure vital signs reviewed and stable Respiratory status: spontaneous breathing, nonlabored ventilation, respiratory function stable and patient connected to nasal cannula oxygen Cardiovascular status: blood pressure returned to baseline and stable Postop Assessment: no signs of nausea or vomiting Anesthetic complications: no       Last Vitals:  Vitals:   10/28/16 1707 10/28/16 1726  BP: 134/63 (!) 143/71  Pulse:  60  Resp: 12 12  Temp: 36.5 C 36.8 C    Last Pain:  Vitals:   10/28/16 1821  TempSrc:   PainSc: 8                  Caydence Enck,E. Bailley [place]

## 2016-10-29 ENCOUNTER — Encounter: Payer: Self-pay | Admitting: Medical Oncology

## 2016-10-29 ENCOUNTER — Encounter (HOSPITAL_COMMUNITY): Payer: Self-pay | Admitting: Urology

## 2016-10-29 DIAGNOSIS — C61 Malignant neoplasm of prostate: Secondary | ICD-10-CM | POA: Diagnosis not present

## 2016-10-29 LAB — BASIC METABOLIC PANEL
ANION GAP: 8 (ref 5–15)
BUN: 8 mg/dL (ref 6–20)
CHLORIDE: 101 mmol/L (ref 101–111)
CO2: 26 mmol/L (ref 22–32)
Calcium: 8.6 mg/dL — ABNORMAL LOW (ref 8.9–10.3)
Creatinine, Ser: 0.82 mg/dL (ref 0.61–1.24)
GFR calc Af Amer: >60 mL/min (ref >60)
GFR calc non Af Amer: >60 mL/min (ref >60)
GLUCOSE: 171 mg/dL — AB (ref 65–99)
POTASSIUM: 3.8 mmol/L (ref 3.5–5.1)
Sodium: 135 mmol/L (ref 135–145)

## 2016-10-29 LAB — CREATININE, FLUID (PLEURAL, PERITONEAL, JP DRAINAGE)
CREAT FL: 34.2 mg/dL
CREAT FL: 0.7 mg/dL

## 2016-10-29 LAB — HEMOGLOBIN AND HEMATOCRIT, BLOOD
HCT: 37.8 % — ABNORMAL LOW (ref 39.0–52.0)
Hemoglobin: 13.3 g/dL (ref 13.0–17.0)

## 2016-10-29 MED ORDER — BISACODYL 10 MG RE SUPP
10.0000 mg | Freq: Once | RECTAL | Status: AC
  Administered 2016-10-29: 10 mg via RECTAL
  Filled 2016-10-29: qty 1

## 2016-10-29 MED ORDER — HYDROCODONE-ACETAMINOPHEN 5-325 MG PO TABS
1.0000 | ORAL_TABLET | Freq: Four times a day (QID) | ORAL | Status: DC | PRN
  Filled 2016-10-29: qty 2

## 2016-10-29 NOTE — Discharge Summary (Signed)
  Date of admission: 10/28/2016  Date of discharge: 10/29/2016  Admission diagnosis: Prostate Cancer  Discharge diagnosis: Prostate Cancer  History and Physical: For full details, please see admission history and physical. Briefly, Matthew Johnson is a 56 y.o. gentleman with localized prostate cancer.  After discussing management/treatment options, he elected to proceed with surgical treatment.  Hospital Course: Dawn Kiper Frisk was taken to the operating room on 10/28/2016 and underwent a robotic assisted laparoscopic radical prostatectomy. He tolerated this procedure well and without complications. Postoperatively, he was able to be transferred to a regular hospital room following recovery from anesthesia.  He was able to begin ambulating the night of surgery. He remained hemodynamically stable overnight.  He had excellent urine output but had transient increased output from his drain confirmed to be a urine leak.  This quickly resolved but it was decided to keep his drain as a precaution.  He was transitioned to oral pain medication, tolerated a clear liquid diet, and had met all discharge criteria and was able to be discharged home later on POD#1.  Laboratory values:  Recent Labs  10/28/16 1609 10/29/16 0444  HGB 13.4 13.3  HCT 38.5* 37.8*    Disposition: Home  Discharge instruction: He was instructed to be ambulatory but to refrain from heavy lifting, strenuous activity, or driving. He was instructed on urethral catheter care.  Discharge medications:  Allergies as of 10/29/2016   No Known Allergies     Medication List    STOP taking these medications   aspirin EC 81 MG tablet   B-12 PO   BC HEADACHE POWDER PO   Fish Oil 1200 MG Caps   glucosamine-chondroitin 500-400 MG tablet   indomethacin 75 MG CR capsule Commonly known as:  INDOCIN SR   MULTIVITAMIN PO     TAKE these medications   CLEAR EYES FOR DRY EYES OP Place 2 drops into both eyes daily as needed (dry  eyes).   desonide 0.05 % cream Commonly known as:  DESOWEN Apply 1 application topically daily as needed (skin irritation).   diltiazem 360 MG 24 hr capsule Commonly known as:  CARDIZEM CD Take 360 mg by mouth daily.   lovastatin 20 MG tablet Commonly known as:  MEVACOR Take 20 mg by mouth every evening. At 1900   oxyCODONE-acetaminophen 5-325 MG tablet Commonly known as:  PERCOCET 1-2 tabs po q6 hours prn pain   Potassium 99 MG Tabs Take 99 mg by mouth daily.   RABEprazole 20 MG tablet Commonly known as:  ACIPHEX Take 20 mg by mouth daily as needed (for heartburn).   sulfamethoxazole-trimethoprim 800-160 MG tablet Commonly known as:  BACTRIM DS,SEPTRA DS Take 1 tablet by mouth 2 (two) times daily. Start the day prior to foley removal appointment   valsartan 160 MG tablet Commonly known as:  DIOVAN Take 160 mg by mouth daily.       Followup: He will followup in 1 week for catheter removal and to discuss his surgical pathology results.  He will see me next Monday for drain removal.

## 2016-10-29 NOTE — Progress Notes (Signed)
Patient ID: Matthew Johnson, male   DOB: 1960/11/22, 56 y.o.   MRN: [id number]   Drain Cr from earlier > 30.  However, drain output has remained low all day.  Will plan to d/c patient with drain over the weekend.  He will return on Monday and record outputs over the weekend and we will likely remove drain in the office on Monday.  He likely had a transient urine leak that has resolved.

## 2016-10-29 NOTE — Progress Notes (Signed)
Mr. Brill up ambulating in hall. He states he is doing "ok" but did not have a great night. He states he is very sore in the groin area but walking is helping. We discussed the importance of walking after discharge and doing deep breathing. He had questions regarding pathology results and what the next steps would be if the disease is not contained. I answered his questions and informed him that Dr. Alinda Money will discuss his pathology report in depth at his follow up visit.  He informed me he had a stent placed due to thin tissue in the bladder neck. He is not sure of  discharge today, this is pending on lab results for urine leakage. I will continue to follow and asked him to call me with any questions or concerns.

## 2016-10-29 NOTE — Progress Notes (Signed)
Patient ID: Matthew Johnson, male   DOB: November 13, 1960, 56 y.o.   MRN: [id number]  1 Day Post-Op Subjective: The patient is doing well.  No nausea or vomiting. Pain is adequately controlled.  Drain output increased transiently overnight but now decreased again.  UOP has been excellent.  Objective: Vital signs in last 24 hours: Temp:  [97.6 F (36.4 C)-98.6 F (37 C)] 97.9 F (36.6 C) (02/02 [id number]) Pulse Rate:  [58-67] 58 (02/02 0608) Resp:  [8-20] 20 (02/02 0608) BP: (124-156)/(63-85) 137/65 (02/02 0608) SpO2:  [94 %-100 %] 98 % (02/02 [id number]) Weight:  [112 kg (247 lb)] 112 kg (247 lb) (02/01 0922)  Intake/Output from previous day: 02/01 0701 - 02/02 0700 In: 4365 [I.V.:3345; IV Piggyback:1000] Out: 3709 [Urine:2975; Drains:584; Blood:150] Intake/Output this shift: No intake/output data recorded.  Physical Exam:  General: Alert and oriented. CV: RRR Lungs: Clear bilaterally. GI: Soft, Nondistended. Incisions: Clean, dry, and intact Urine: Clear Extremities: Nontender, no erythema, no edema.  Lab Results:  Recent Labs  10/26/16 1052 10/28/16 1609 10/29/16 0444  HGB 14.5 13.4 13.3  HCT 41.3 38.5* 37.8*      Assessment/Plan: POD# 1 s/p robotic prostatectomy.  1) SL IVF 2) Ambulate, Incentive spirometry 3) Transition to oral pain medication 4) Dulcolax suppository 5) Will check drain Cr and evaluate for urine leak 6) Plan for likely discharge later today if no urine leak   Roxy Horseman, Brooke Bonito. MD   LOS: 0 days   Rhyli Depaula,LES 10/29/2016, 7:59 AM

## 2016-10-29 NOTE — Progress Notes (Signed)
Went over all discharge information with patient and family. All questions answered.Discharge paper work and prescriptions given to patient.Explained importance of taking medications as prescribed.All questions answered.Pt discharged via wheelchair.    Pt educated on foley and JP care with teach back.

## 2016-10-29 NOTE — Care Management Note (Signed)
Case Management Note  Patient Details  Name: Nova Tienken Study MRN: [id number] Date of Birth: Jan 16, 1961  Subjective/Objective:56 y/o m admitted w/Prostate Ca. From home.                    Action/Plan:d/c plan home.   Expected Discharge Date:                  Expected Discharge Plan:  Home/Self Care  In-House Referral:     Discharge planning Services  CM Consult  Post Acute Care Choice:    Choice offered to:     DME Arranged:    DME Agency:     HH Arranged:    HH Agency:     Status of Service:  In process, will continue to follow  If discussed at Long Length of Stay Meetings, dates discussed:    Additional Comments:  Dessa Phi, RN 10/29/2016, 11:29 AM

## 2016-11-05 ENCOUNTER — Other Ambulatory Visit: Payer: BLUE CROSS/BLUE SHIELD | Admitting: *Deleted

## 2016-11-06 LAB — APOLIPOPROTEIN A-1: APOLIPOPROTEIN A-1: 117 mg/dL (ref 101–178)

## 2016-11-06 LAB — HEPATIC FUNCTION PANEL
ALBUMIN: 3.8 g/dL (ref 3.5–5.5)
ALT: 19 IU/L (ref 0–44)
AST: 16 IU/L (ref 0–40)
Alkaline Phosphatase: 86 IU/L (ref 39–117)
BILIRUBIN, DIRECT: 0.32 mg/dL (ref 0.00–0.40)
Bilirubin Total: 1 mg/dL (ref 0.0–1.2)
TOTAL PROTEIN: 6.8 g/dL (ref 6.0–8.5)

## 2016-11-06 LAB — APOLIPOPROTEIN B: Apolipoprotein B: 83 mg/dL (ref 52–135)

## 2017-09-05 IMAGING — CT CT HEART SCORING
2 series · 16 of 20 positions shown, 18 images · non-contrast
Comparison: None.

CLINICAL DATA: Risk stratification

EXAM:
Coronary Calcium Score
TECHNIQUE: The patient was scanned on a Siemens Somatom 64 slice scanner. Axial
non-contrast 3mm slices were carried out through the heart. The data
set was analyzed on a dedicated work station and scored using the
Agatson method.

[Series 2: casc 3.0 i36f 2 bestdiast 69 % · axial · 0.40mm/px · z∈[-257,-146]mm · 8 of 49 slices shown, 10 images]
[im 6/49  vessel]
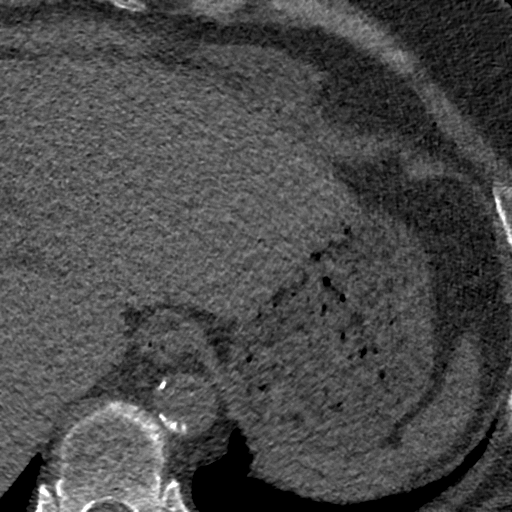
[im 6/49  lung]
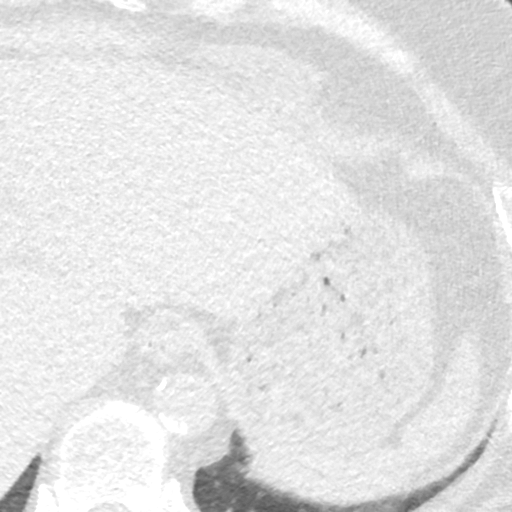
[im 11/49  vessel]
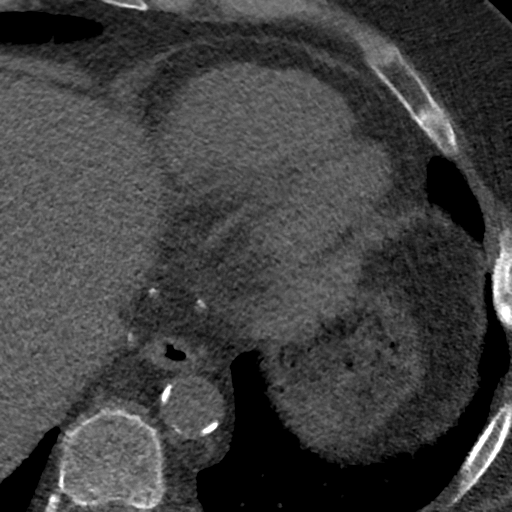
[im 17/49  vessel]
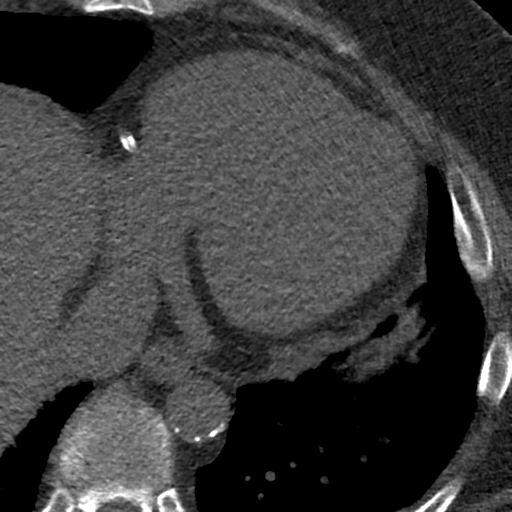
[im 22/49  vessel]
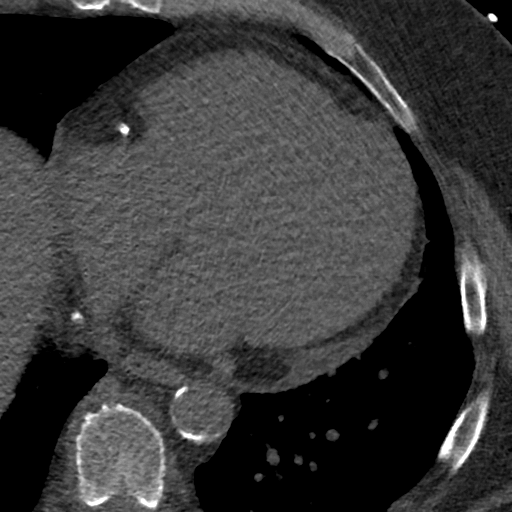
[im 27/49  vessel]
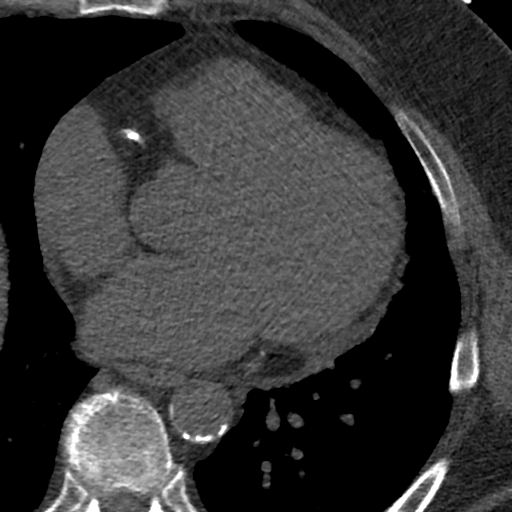
[im 27/49  lung]
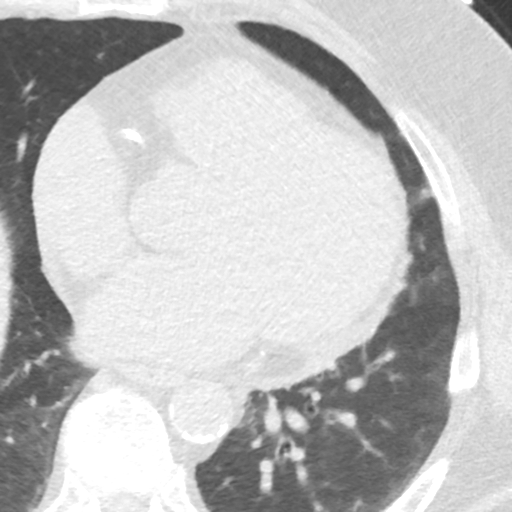
[im 33/49  vessel]
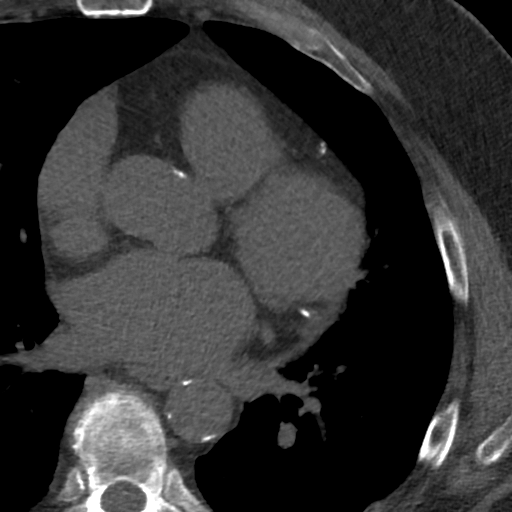
[im 38/49  vessel]
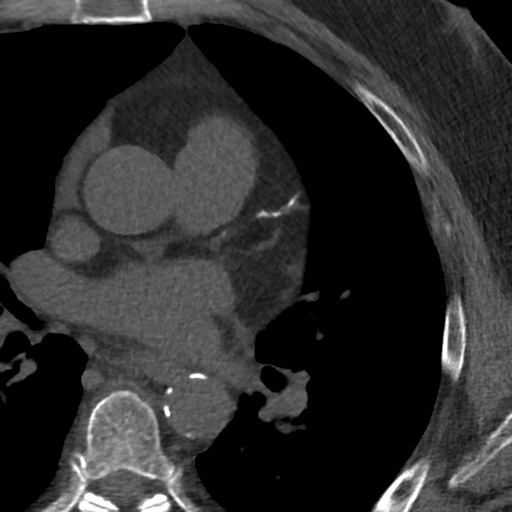
[im 43/49  vessel]
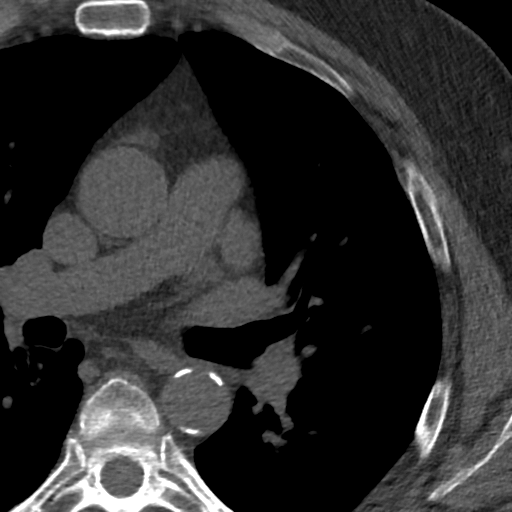

[Series 4: lung st 69 % · axial · 0.77mm/px · z∈[-256,-146]mm · 8 of 49 slices shown]
[im 6/49  lung]
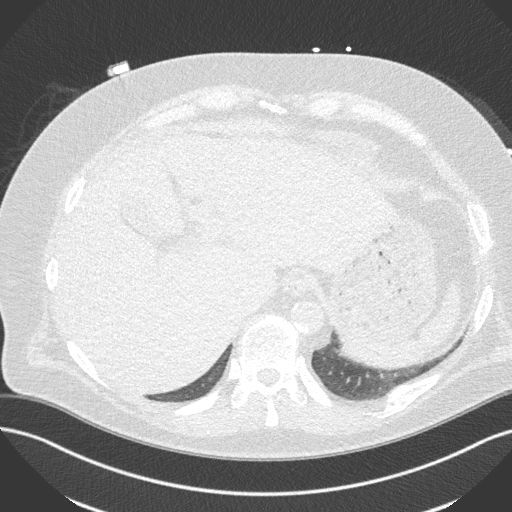
[im 11/49  lung]
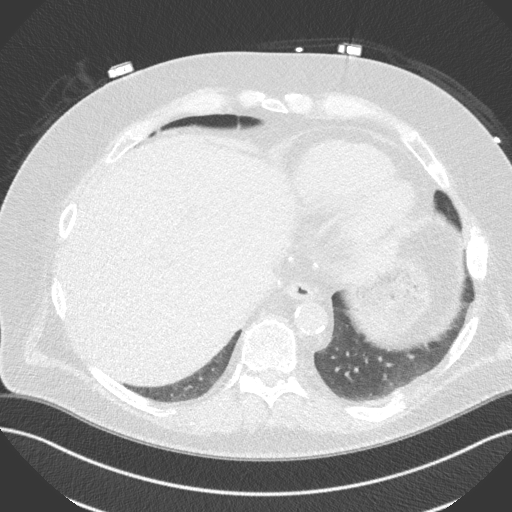
[im 17/49  lung]
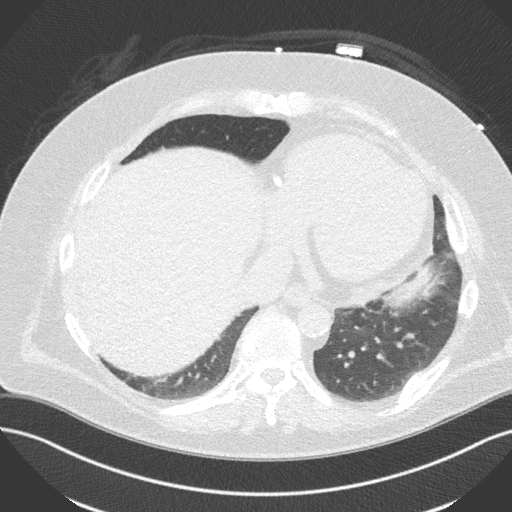
[im 22/49  lung]
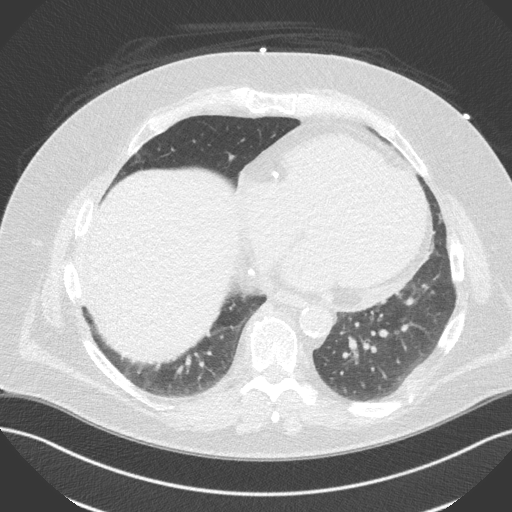
[im 27/49  lung]
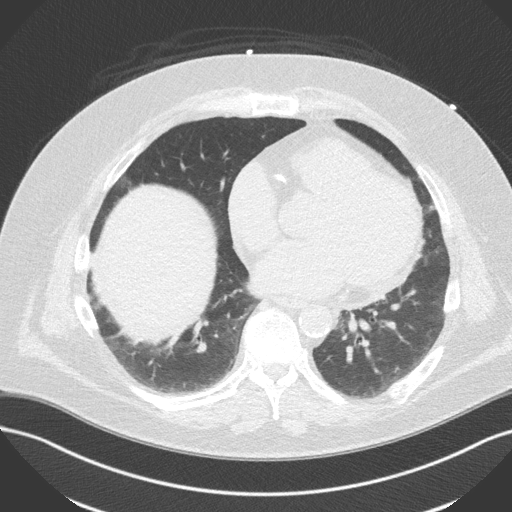
[im 33/49  lung]
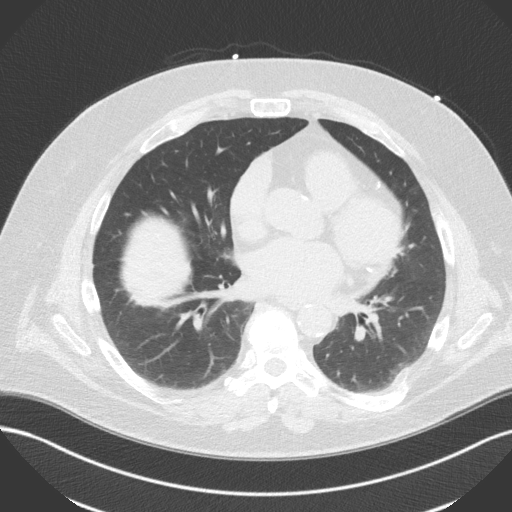
[im 38/49  lung]
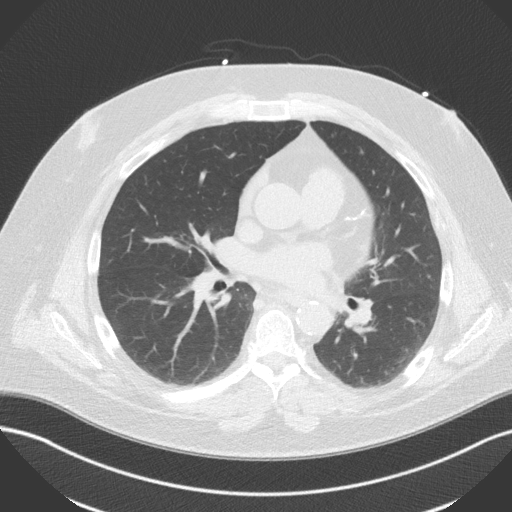
[im 43/49  lung]
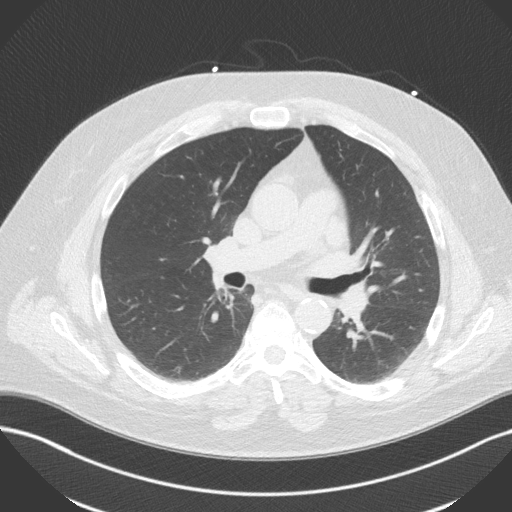

[16 of 20 positions shown; findings below may reference images not displayed]

FINDINGS: Non-cardiac: No significant non cardiac findings on limited lung and
soft tissue windows. See separate report from [REDACTED].

Ascending Aorta:  3.9 cm

Pericardium: Normal

Coronary arteries:  Marked diffuse LM and 3V coronary calcification
IMPRESSION: Coronary calcium score of 4069. This was over the 777th percentile
for age and sex matched control.

Consider f/u perfusion study given how high calcium score is

Mami Zenteno

EXAM:
OVER-READ INTERPRETATION  CT CHEST

The following report is an over-read performed by radiologist Dr.
over-read does not include interpretation of cardiac or coronary
anatomy or pathology. The coronary calcium score interpretation by
the cardiologist is attached.
FINDINGS: Aortic atherosclerosis. 4 mm nodule in the anterior aspect of the
right lower lobe abutting the major fissure (image 10 of series 3).
Within the visualized portions of the thorax there are no larger
more suspicious appearing pulmonary nodules or masses, there is no
acute consolidative airspace disease, no pleural effusions, no
pneumothorax and no lymphadenopathy. Visualized portions of the
upper abdomen are unremarkable. Multiple old healed left-sided rib
fractures are incidentally noted. There is also an old healed
fracture of the inferior aspect of the sternum. No aggressive
appearing lytic or blastic lesions are noted in the visualized
portions of the skeleton.
IMPRESSION: 1. Aortic atherosclerosis.
2. 4 mm subpleural nodule in the anterior aspect of the right lower
lobe abutting the major fissure (image 10 of series 3). This is
highly nonspecific, this finding is stable compared to remote prior
study 05/29/2015, considered benign, presumably a subpleural lymph
node.
3. Old posttraumatic findings, as above.

## 2017-10-07 ENCOUNTER — Other Ambulatory Visit: Payer: Self-pay

## 2017-10-07 DIAGNOSIS — I77810 Thoracic aortic ectasia: Secondary | ICD-10-CM

## 2017-10-13 ENCOUNTER — Inpatient Hospital Stay: Admission: RE | Admit: 2017-10-13 | Payer: BLUE CROSS/BLUE SHIELD | Source: Ambulatory Visit

## 2017-10-28 ENCOUNTER — Other Ambulatory Visit: Payer: BLUE CROSS/BLUE SHIELD

## 2017-11-04 ENCOUNTER — Inpatient Hospital Stay: Admission: RE | Admit: 2017-11-04 | Payer: BLUE CROSS/BLUE SHIELD | Source: Ambulatory Visit

## 2017-12-26 DEATH — deceased
